# Patient Record
Sex: Female | Born: 1955 | Race: Black or African American | Hispanic: No | Marital: Single | Smoking: Never smoker
Health system: Southern US, Community
[De-identification: ages and names within clinical notes are randomized; demographics above are authoritative.]

## PROBLEM LIST (undated history)

## (undated) DIAGNOSIS — I639 Cerebral infarction, unspecified: Secondary | ICD-10-CM

## (undated) DIAGNOSIS — E119 Type 2 diabetes mellitus without complications: Secondary | ICD-10-CM

## (undated) DIAGNOSIS — I10 Essential (primary) hypertension: Secondary | ICD-10-CM

## (undated) DIAGNOSIS — I2089 Other forms of angina pectoris: Secondary | ICD-10-CM

## (undated) DIAGNOSIS — I208 Other forms of angina pectoris: Secondary | ICD-10-CM

## (undated) DIAGNOSIS — I509 Heart failure, unspecified: Secondary | ICD-10-CM

## (undated) HISTORY — PX: CHOLECYSTECTOMY: SHX55

## (undated) HISTORY — PX: ABDOMINAL HYSTERECTOMY: SHX81

---

## 2006-05-06 ENCOUNTER — Emergency Department (HOSPITAL_COMMUNITY): Admission: EM | Admit: 2006-05-06 | Discharge: 2006-05-06 | Payer: Self-pay | Admitting: Emergency Medicine

## 2011-03-09 ENCOUNTER — Emergency Department (HOSPITAL_BASED_OUTPATIENT_CLINIC_OR_DEPARTMENT_OTHER)
Admission: EM | Admit: 2011-03-09 | Discharge: 2011-03-09 | Disposition: A | Discharge status: Home / Self Care | Payer: Medicare Other | Attending: Emergency Medicine | Admitting: Emergency Medicine

## 2011-03-09 ENCOUNTER — Emergency Department (INDEPENDENT_AMBULATORY_CARE_PROVIDER_SITE_OTHER): Payer: Medicare Other

## 2011-03-09 DIAGNOSIS — R1110 Vomiting, unspecified: Secondary | ICD-10-CM

## 2011-03-09 DIAGNOSIS — R059 Cough, unspecified: Secondary | ICD-10-CM

## 2011-03-09 DIAGNOSIS — R109 Unspecified abdominal pain: Secondary | ICD-10-CM

## 2011-03-09 DIAGNOSIS — R05 Cough: Secondary | ICD-10-CM

## 2011-03-09 DIAGNOSIS — J329 Chronic sinusitis, unspecified: Secondary | ICD-10-CM | POA: Insufficient documentation

## 2011-03-09 DIAGNOSIS — Z79899 Other long term (current) drug therapy: Secondary | ICD-10-CM | POA: Insufficient documentation

## 2011-03-09 LAB — DIFFERENTIAL
Basophils Absolute: 0 10*3/uL (ref 0.0–0.1)
Eosinophils Absolute: 0.1 10*3/uL (ref 0.0–0.7)
Eosinophils Relative: 1 % (ref 0–5)
Lymphs Abs: 2.7 10*3/uL (ref 0.7–4.0)
Neutrophils Relative %: 65 % (ref 43–77)

## 2011-03-09 LAB — BASIC METABOLIC PANEL
CO2: 26 mEq/L (ref 19–32)
Chloride: 98 mEq/L (ref 96–112)
GFR calc Af Amer: >60 mL/min (ref >60)
Potassium: 3.9 mEq/L (ref 3.5–5.1)
Sodium: 137 mEq/L (ref 135–145)

## 2011-03-09 LAB — URINALYSIS, ROUTINE W REFLEX MICROSCOPIC
Glucose, UA: NEGATIVE mg/dL
Ketones, ur: NEGATIVE mg/dL
Leukocytes, UA: NEGATIVE
Nitrite: NEGATIVE
Protein, ur: NEGATIVE mg/dL
pH: 5.5 (ref 5.0–8.0)

## 2011-03-09 LAB — CBC
MCV: 78.7 fL (ref 78.0–100.0)
Platelets: 298 10*3/uL (ref 150–400)
RBC: 4.22 MIL/uL (ref 3.87–5.11)
RDW: 14.2 % (ref 11.5–15.5)
WBC: 10.1 10*3/uL (ref 4.0–10.5)

## 2011-03-12 ENCOUNTER — Emergency Department (HOSPITAL_BASED_OUTPATIENT_CLINIC_OR_DEPARTMENT_OTHER)
Admission: EM | Admit: 2011-03-12 | Discharge: 2011-03-12 | Disposition: A | Discharge status: Home / Self Care | Payer: Medicare Other | Attending: Emergency Medicine | Admitting: Emergency Medicine

## 2011-03-12 DIAGNOSIS — I10 Essential (primary) hypertension: Secondary | ICD-10-CM | POA: Insufficient documentation

## 2011-03-12 DIAGNOSIS — K219 Gastro-esophageal reflux disease without esophagitis: Secondary | ICD-10-CM | POA: Insufficient documentation

## 2011-03-12 DIAGNOSIS — E119 Type 2 diabetes mellitus without complications: Secondary | ICD-10-CM | POA: Insufficient documentation

## 2011-03-12 DIAGNOSIS — J329 Chronic sinusitis, unspecified: Secondary | ICD-10-CM | POA: Insufficient documentation

## 2011-03-12 DIAGNOSIS — J45909 Unspecified asthma, uncomplicated: Secondary | ICD-10-CM | POA: Insufficient documentation

## 2011-03-12 DIAGNOSIS — Z79899 Other long term (current) drug therapy: Secondary | ICD-10-CM | POA: Insufficient documentation

## 2011-03-12 DIAGNOSIS — I509 Heart failure, unspecified: Secondary | ICD-10-CM | POA: Insufficient documentation

## 2013-08-15 ENCOUNTER — Ambulatory Visit (INDEPENDENT_AMBULATORY_CARE_PROVIDER_SITE_OTHER): Payer: Medicare Other | Admitting: Podiatry

## 2013-08-15 ENCOUNTER — Encounter: Payer: Self-pay | Admitting: Podiatry

## 2013-08-15 VITALS — BP 172/79 | HR 86 | Ht 67.0 in | Wt 296.0 lb

## 2013-08-15 DIAGNOSIS — M216X9 Other acquired deformities of unspecified foot: Secondary | ICD-10-CM

## 2013-08-15 DIAGNOSIS — M722 Plantar fascial fibromatosis: Secondary | ICD-10-CM

## 2013-08-15 DIAGNOSIS — M21969 Unspecified acquired deformity of unspecified lower leg: Secondary | ICD-10-CM | POA: Insufficient documentation

## 2013-08-15 DIAGNOSIS — M216X1 Other acquired deformities of right foot: Secondary | ICD-10-CM

## 2013-08-15 MED ORDER — AMPICILLIN 500 MG PO CAPS: 500.0000 mg | ORAL_CAPSULE | Freq: Four times a day (QID) | ORAL | Status: DC

## 2013-08-15 NOTE — Progress Notes (Signed)
Right heel pain x for a month. Also gets ingrown nails on both big toes, and calluses under the big toes.  Blood sugar is under control. Usual readings are 117, 130, 140, 160.   Review of Systems - General ROS: positive for  - fever, night sweats and weight gain Ophthalmic ROS: Poor vision and difficulty reading. ENT ROS: Feel like sinus is infected. Allergy and Immunology ROS: negative Breast ROS: negative for breast lumps Respiratory ROS: no cough, shortness of breath, or wheezing Cardiovascular ROS: Has a mild congestive heart failure. Gastrointestinal ROS: problem with food digesting.  Genito-Urinary ROS: no dysuria, trouble voiding, or hematuria Musculoskeletal ROS: negative Dermatological ROS: negative . Objective: Dermatologic: Plantar callus under the left great toe. Both great toe nails are ingrown without infection. Vascular: Left dorsalis pedis artery is palpable. Right side is not palpable.  Orthopedic: Tight Achilles tendon on right. Unstable first ray bilateral.   Assessment: Ingrown nails both great toes. Right heel pain. Unstable first ray bilateral. Ankle equinus right.  Plan: Calluses debrided. Need stretch exercise for tight tendon. Need to wear well supported tennis shoes with custom orthotics. Diabetic shoes may benefit. Will do ingrown nails surgery on left side on her next visit.

## 2013-08-15 NOTE — Patient Instructions (Signed)
For right heel pain, need to wear well supported Tennis shoes and do Achilles tendon stretch exercise.  For ingrown toe nail, we will schedule for ingrown nails surgery on left. May take antibiotics for sinus infection.

## 2013-08-23 ENCOUNTER — Ambulatory Visit: Payer: Medicare Other | Admitting: Podiatry

## 2013-08-28 ENCOUNTER — Encounter (HOSPITAL_BASED_OUTPATIENT_CLINIC_OR_DEPARTMENT_OTHER): Payer: Self-pay | Admitting: Emergency Medicine

## 2013-08-28 ENCOUNTER — Emergency Department (HOSPITAL_BASED_OUTPATIENT_CLINIC_OR_DEPARTMENT_OTHER): Payer: Medicare Other

## 2013-08-28 ENCOUNTER — Emergency Department (HOSPITAL_BASED_OUTPATIENT_CLINIC_OR_DEPARTMENT_OTHER)
Admission: EM | Admit: 2013-08-28 | Discharge: 2013-08-28 | Disposition: A | Discharge status: Home / Self Care | Payer: Medicare Other | Attending: Emergency Medicine | Admitting: Emergency Medicine

## 2013-08-28 DIAGNOSIS — I10 Essential (primary) hypertension: Secondary | ICD-10-CM | POA: Insufficient documentation

## 2013-08-28 DIAGNOSIS — R519 Headache, unspecified: Secondary | ICD-10-CM

## 2013-08-28 DIAGNOSIS — Z79899 Other long term (current) drug therapy: Secondary | ICD-10-CM | POA: Insufficient documentation

## 2013-08-28 DIAGNOSIS — E119 Type 2 diabetes mellitus without complications: Secondary | ICD-10-CM | POA: Insufficient documentation

## 2013-08-28 DIAGNOSIS — Z8673 Personal history of transient ischemic attack (TIA), and cerebral infarction without residual deficits: Secondary | ICD-10-CM | POA: Insufficient documentation

## 2013-08-28 DIAGNOSIS — R51 Headache: Secondary | ICD-10-CM | POA: Insufficient documentation

## 2013-08-28 HISTORY — DX: Type 2 diabetes mellitus without complications: E11.9

## 2013-08-28 HISTORY — DX: Cerebral infarction, unspecified: I63.9

## 2013-08-28 HISTORY — DX: Essential (primary) hypertension: I10

## 2013-08-28 MED ORDER — SODIUM CHLORIDE 0.9 % IV BOLUS (SEPSIS)
1000.0000 mL | Freq: Once | INTRAVENOUS | Status: AC
  Administered 2013-08-28: 1000 mL via INTRAVENOUS

## 2013-08-28 MED ORDER — DEXAMETHASONE SODIUM PHOSPHATE 10 MG/ML IJ SOLN
10.0000 mg | Freq: Once | INTRAMUSCULAR | Status: AC
  Administered 2013-08-28: 10 mg via INTRAVENOUS
  Filled 2013-08-28: qty 1

## 2013-08-28 MED ORDER — DIPHENHYDRAMINE HCL 50 MG/ML IJ SOLN
25.0000 mg | Freq: Once | INTRAMUSCULAR | Status: AC
  Administered 2013-08-28: 25 mg via INTRAVENOUS
  Filled 2013-08-28: qty 1

## 2013-08-28 MED ORDER — METOCLOPRAMIDE HCL 5 MG/ML IJ SOLN
10.0000 mg | Freq: Once | INTRAMUSCULAR | Status: AC
  Administered 2013-08-28: 10 mg via INTRAVENOUS
  Filled 2013-08-28: qty 2

## 2013-08-28 NOTE — ED Provider Notes (Signed)
CSN: [id number]     Arrival date & time 08/28/13  1255 History   First MD Initiated Contact with Patient 08/28/13 1359     Chief Complaint  Patient presents with  . Headache   (Consider location/radiation/quality/duration/timing/severity/associated sxs/prior Treatment) HPI Comments: Patient is a 57 year old female with history of diabetes and prior stroke. She presents today with complaints of frontal headache that she has been experiencing for the past week. It has been worse over the past 2 days. She denies any injury or trauma. She denies any numbness or tingling. She denies any visual disturbances. She is no history of migraine headaches states that this is very unusual for her.  Patient is a 57 y.o. female presenting with headaches. The history is provided by the patient.  Headache Pain location:  Frontal Quality:  Stabbing Radiates to:  Does not radiate Onset quality:  Gradual Duration:  7 days Timing:  Constant Progression:  Worsening Chronicity:  New Similar to prior headaches: no   Context: activity and bright light   Context: not caffeine   Relieved by:  Nothing Worsened by:  Nothing tried Ineffective treatments:  None tried   Past Medical History  Diagnosis Date  . Stroke   . Hypertension   . Diabetes mellitus without complication    History reviewed. No pertinent past surgical history. No family history on file. History  Substance Use Topics  . Smoking status: Never Smoker   . Smokeless tobacco: Never Used  . Alcohol Use: Not on file   OB History   Grav Para Term Preterm Abortions TAB SAB Ect Mult Living                 Review of Systems  Neurological: Positive for headaches.  All other systems reviewed and are negative.    Allergies  Review of patient's allergies indicates no known allergies.  Home Medications   Current Outpatient Rx  Name  Route  Sig  Dispense  Refill  . carbamazepine (TEGRETOL) 100 MG chewable tablet   Oral   Chew 100 mg by  mouth daily.          Marland Kitchen DEXILANT 60 MG capsule   Oral   Take 60 mg by mouth as needed.          . hydrochlorothiazide (HYDRODIURIL) 25 MG tablet   Oral   Take 25 mg by mouth daily.          Marland Kitchen lisinopril (PRINIVIL,ZESTRIL) 40 MG tablet   Oral   Take 40 mg by mouth daily.          . metFORMIN (GLUCOPHAGE) 1000 MG tablet   Oral   Take 1,000 mg by mouth 2 (two) times daily with a meal.          . metoprolol (LOPRESSOR) 50 MG tablet   Oral   Take 50 mg by mouth 2 (two) times daily.          Marland Kitchen NITROSTAT 0.4 MG SL tablet   Sublingual   Place 0.4 mg under the tongue every 5 (five) minutes as needed.          . sucralfate (CARAFATE) 1 G tablet   Oral   Take 1 g by mouth 2 (two) times daily.           BP 141/99  Pulse 64  Temp(Src) 98.8 F (37.1 C) (Oral)  Resp 18  SpO2 99% Physical Exam  Nursing note and vitals reviewed. Constitutional: She is oriented to person,  place, and time. She appears well-developed and well-nourished. No distress.  HENT:  Head: Normocephalic and atraumatic.  Eyes: EOM are normal. Pupils are equal, round, and reactive to light.  There is no papilledema on funduscopic examination  Neck: Normal range of motion. Neck supple.  Cardiovascular: Normal rate and regular rhythm.  Exam reveals no gallop and no friction rub.   No murmur heard. Pulmonary/Chest: Effort normal and breath sounds normal. No respiratory distress. She has no wheezes.  Abdominal: Soft. Bowel sounds are normal. She exhibits no distension. There is no tenderness.  Musculoskeletal: Normal range of motion.  Neurological: She is alert and oriented to person, place, and time. No cranial nerve deficit. She exhibits normal muscle tone. Coordination normal.  Skin: Skin is warm and dry. She is not diaphoretic.    ED Course  Procedures (including critical care time) Labs Review Labs Reviewed - No data to display Imaging Review No results found.    MDM  No diagnosis  found. The patient presents with headache for the past several days. Neurologic exam is nonfocal and CAT scan is unremarkable. There is no evidence for intracranial pathology and no evidence for sinus disease. She is feeling better with medications given and will be discharged to home with when necessary followup.    Geoffery Lyons, MD 08/28/13 1538

## 2013-08-28 NOTE — ED Notes (Signed)
Patient here with frontal headache x 2 days, taking otc meds without relief. Nausea with same, denies injury

## 2013-09-05 ENCOUNTER — Ambulatory Visit (INDEPENDENT_AMBULATORY_CARE_PROVIDER_SITE_OTHER): Payer: Medicare Other | Admitting: Podiatry

## 2013-09-05 ENCOUNTER — Encounter: Payer: Self-pay | Admitting: Podiatry

## 2013-09-05 VITALS — BP 142/74 | HR 81 | Ht 67.0 in | Wt 296.0 lb

## 2013-09-05 DIAGNOSIS — E1149 Type 2 diabetes mellitus with other diabetic neurological complication: Secondary | ICD-10-CM

## 2013-09-05 DIAGNOSIS — M21969 Unspecified acquired deformity of unspecified lower leg: Secondary | ICD-10-CM

## 2013-09-05 DIAGNOSIS — M722 Plantar fascial fibromatosis: Secondary | ICD-10-CM

## 2013-09-05 DIAGNOSIS — M216X1 Other acquired deformities of right foot: Secondary | ICD-10-CM

## 2013-09-05 DIAGNOSIS — M216X9 Other acquired deformities of unspecified foot: Secondary | ICD-10-CM

## 2013-09-05 NOTE — Progress Notes (Signed)
Subjective: Right foot pain on heel. Patient took care of her ingrown nail problem.  Stated that her right foot pulse was detectable at her primary care physician's office.   Findings:  Elevated first ray bilateral. Ankle equinus bilateral. STJ hyperpronation bilateral. Plantar fasciitis right.  Plan: Reviewed available options, Orthotics, diabetics shoes. Today both feet were measured for diabetic shoes.

## 2013-09-05 NOTE — Patient Instructions (Signed)
Both feet were measured for diabetic shoes. Will contact patient when they are ready.

## 2013-09-20 ENCOUNTER — Ambulatory Visit: Payer: Medicare Other | Admitting: Podiatry

## 2013-09-26 ENCOUNTER — Ambulatory Visit: Payer: Medicare Other | Admitting: Podiatry

## 2014-01-05 ENCOUNTER — Encounter (HOSPITAL_BASED_OUTPATIENT_CLINIC_OR_DEPARTMENT_OTHER): Payer: Self-pay | Admitting: Emergency Medicine

## 2014-01-05 ENCOUNTER — Other Ambulatory Visit: Payer: Self-pay

## 2014-01-05 ENCOUNTER — Observation Stay (HOSPITAL_COMMUNITY): Payer: Medicare Other

## 2014-01-05 ENCOUNTER — Observation Stay (HOSPITAL_BASED_OUTPATIENT_CLINIC_OR_DEPARTMENT_OTHER)
Admission: EM | Admit: 2014-01-05 | Discharge: 2014-01-06 | Disposition: A | Discharge status: Home / Self Care | Payer: Medicare Other | Attending: Internal Medicine | Admitting: Internal Medicine

## 2014-01-05 DIAGNOSIS — I10 Essential (primary) hypertension: Secondary | ICD-10-CM | POA: Diagnosis present

## 2014-01-05 DIAGNOSIS — R079 Chest pain, unspecified: Principal | ICD-10-CM | POA: Diagnosis present

## 2014-01-05 DIAGNOSIS — D72829 Elevated white blood cell count, unspecified: Secondary | ICD-10-CM | POA: Insufficient documentation

## 2014-01-05 DIAGNOSIS — Z7982 Long term (current) use of aspirin: Secondary | ICD-10-CM | POA: Diagnosis not present

## 2014-01-05 DIAGNOSIS — R0602 Shortness of breath: Secondary | ICD-10-CM | POA: Insufficient documentation

## 2014-01-05 DIAGNOSIS — D649 Anemia, unspecified: Secondary | ICD-10-CM | POA: Insufficient documentation

## 2014-01-05 DIAGNOSIS — M79609 Pain in unspecified limb: Secondary | ICD-10-CM | POA: Insufficient documentation

## 2014-01-05 DIAGNOSIS — E119 Type 2 diabetes mellitus without complications: Secondary | ICD-10-CM | POA: Diagnosis not present

## 2014-01-05 DIAGNOSIS — I509 Heart failure, unspecified: Secondary | ICD-10-CM | POA: Insufficient documentation

## 2014-01-05 DIAGNOSIS — Z8673 Personal history of transient ischemic attack (TIA), and cerebral infarction without residual deficits: Secondary | ICD-10-CM | POA: Diagnosis not present

## 2014-01-05 DIAGNOSIS — Z794 Long term (current) use of insulin: Secondary | ICD-10-CM | POA: Diagnosis not present

## 2014-01-05 DIAGNOSIS — R269 Unspecified abnormalities of gait and mobility: Secondary | ICD-10-CM | POA: Insufficient documentation

## 2014-01-05 DIAGNOSIS — R569 Unspecified convulsions: Secondary | ICD-10-CM | POA: Diagnosis not present

## 2014-01-05 HISTORY — DX: Other forms of angina pectoris: I20.89

## 2014-01-05 HISTORY — DX: Heart failure, unspecified: I50.9

## 2014-01-05 HISTORY — DX: Other forms of angina pectoris: I20.8

## 2014-01-05 LAB — CBC WITH DIFFERENTIAL/PLATELET
BASOS PCT: 0 % (ref 0–1)
Basophils Absolute: 0 10*3/uL (ref 0.0–0.1)
Basophils Absolute: 0 10*3/uL (ref 0.0–0.1)
Basophils Relative: 0 % (ref 0–1)
EOS ABS: 0.1 10*3/uL (ref 0.0–0.7)
EOS ABS: 0.2 10*3/uL (ref 0.0–0.7)
EOS PCT: 1 % (ref 0–5)
Eosinophils Relative: 1 % (ref 0–5)
HCT: 33.6 % — ABNORMAL LOW (ref 36.0–46.0)
HCT: 33.6 % — ABNORMAL LOW (ref 36.0–46.0)
HEMOGLOBIN: 11 g/dL — AB (ref 12.0–15.0)
Hemoglobin: 11.3 g/dL — ABNORMAL LOW (ref 12.0–15.0)
LYMPHS ABS: 5 10*3/uL — AB (ref 0.7–4.0)
LYMPHS PCT: 45 % (ref 12–46)
Lymphocytes Relative: 41 % (ref 12–46)
Lymphs Abs: 4.5 10*3/uL — ABNORMAL HIGH (ref 0.7–4.0)
MCH: 27.7 pg (ref 26.0–34.0)
MCH: 26.6 pg (ref 26.0–34.0)
MCHC: 33.6 g/dL (ref 30.0–36.0)
MCHC: 32.7 g/dL (ref 30.0–36.0)
MCV: 82.4 fL (ref 78.0–100.0)
MCV: 81.4 fL (ref 78.0–100.0)
MONOS PCT: 8 % (ref 3–12)
MONOS PCT: 7 % (ref 3–12)
Monocytes Absolute: 0.8 10*3/uL (ref 0.1–1.0)
Monocytes Absolute: 0.8 10*3/uL (ref 0.1–1.0)
NEUTROS PCT: 49 % (ref 43–77)
Neutro Abs: 5.4 10*3/uL (ref 1.7–7.7)
Neutro Abs: 5.1 10*3/uL (ref 1.7–7.7)
Neutrophils Relative %: 47 % (ref 43–77)
PLATELETS: 350 10*3/uL (ref 150–400)
PLATELETS: 335 10*3/uL (ref 150–400)
RBC: 4.08 MIL/uL (ref 3.87–5.11)
RBC: 4.13 MIL/uL (ref 3.87–5.11)
RDW: 13.5 % (ref 11.5–15.5)
RDW: 13.7 % (ref 11.5–15.5)
WBC: 10.8 10*3/uL — ABNORMAL HIGH (ref 4.0–10.5)
WBC: 11.1 10*3/uL — AB (ref 4.0–10.5)

## 2014-01-05 LAB — COMPREHENSIVE METABOLIC PANEL
ALBUMIN: 4 g/dL (ref 3.5–5.2)
ALT: 34 U/L (ref 0–35)
AST: 28 U/L (ref 0–37)
Alkaline Phosphatase: 63 U/L (ref 39–117)
BUN: 24 mg/dL — AB (ref 6–23)
CALCIUM: 9.8 mg/dL (ref 8.4–10.5)
CHLORIDE: 97 meq/L (ref 96–112)
CO2: 20 mEq/L (ref 19–32)
CREATININE: 0.86 mg/dL (ref 0.50–1.10)
GFR calc Af Amer: 84 mL/min — ABNORMAL LOW (ref >90)
GFR calc non Af Amer: 73 mL/min — ABNORMAL LOW (ref >90)
Glucose, Bld: 136 mg/dL — ABNORMAL HIGH (ref 70–99)
Potassium: 4 mEq/L (ref 3.7–5.3)
Sodium: 137 mEq/L (ref 137–147)
Total Protein: 7 g/dL (ref 6.0–8.3)

## 2014-01-05 LAB — TROPONIN I
Troponin I: <0.3 ng/mL (ref <0.30)
Troponin I: <0.3 ng/mL (ref <0.30)
Troponin I: <0.3 ng/mL (ref <0.30)

## 2014-01-05 LAB — BASIC METABOLIC PANEL
BUN: 27 mg/dL — AB (ref 6–23)
CALCIUM: 10 mg/dL (ref 8.4–10.5)
CO2: 24 mEq/L (ref 19–32)
Chloride: 97 mEq/L (ref 96–112)
Creatinine, Ser: 1 mg/dL (ref 0.50–1.10)
GFR, EST AFRICAN AMERICAN: 70 mL/min — AB (ref >90)
GFR, EST NON AFRICAN AMERICAN: 60 mL/min — AB (ref >90)
Glucose, Bld: 151 mg/dL — ABNORMAL HIGH (ref 70–99)
POTASSIUM: 4.8 meq/L (ref 3.7–5.3)
SODIUM: 136 meq/L — AB (ref 137–147)

## 2014-01-05 LAB — GLUCOSE, CAPILLARY
GLUCOSE-CAPILLARY: 170 mg/dL — AB (ref 70–99)
Glucose-Capillary: 133 mg/dL — ABNORMAL HIGH (ref 70–99)
Glucose-Capillary: 157 mg/dL — ABNORMAL HIGH (ref 70–99)
Glucose-Capillary: 147 mg/dL — ABNORMAL HIGH (ref 70–99)

## 2014-01-05 LAB — URINALYSIS, ROUTINE W REFLEX MICROSCOPIC
Bilirubin Urine: NEGATIVE
Glucose, UA: NEGATIVE mg/dL
Hgb urine dipstick: NEGATIVE
KETONES UR: NEGATIVE mg/dL
LEUKOCYTES UA: NEGATIVE
NITRITE: NEGATIVE
PROTEIN: NEGATIVE mg/dL
Specific Gravity, Urine: 1.02 (ref 1.005–1.030)
Urobilinogen, UA: 0.2 mg/dL (ref 0.0–1.0)
pH: 5.5 (ref 5.0–8.0)

## 2014-01-05 LAB — RAPID URINE DRUG SCREEN, HOSP PERFORMED
AMPHETAMINES: NOT DETECTED
Barbiturates: NOT DETECTED
Benzodiazepines: NOT DETECTED
Cocaine: NOT DETECTED
Opiates: NOT DETECTED
TETRAHYDROCANNABINOL: NOT DETECTED

## 2014-01-05 MED ORDER — ASPIRIN EC 325 MG PO TBEC
325.0000 mg | DELAYED_RELEASE_TABLET | Freq: Every day | ORAL | Status: DC
  Administered 2014-01-05 – 2014-01-06 (×2): 325 mg via ORAL
  Filled 2014-01-05 (×2): qty 1

## 2014-01-05 MED ORDER — METOPROLOL TARTRATE 50 MG PO TABS
50.0000 mg | ORAL_TABLET | Freq: Two times a day (BID) | ORAL | Status: DC
  Administered 2014-01-05 – 2014-01-06 (×3): 50 mg via ORAL
  Filled 2014-01-05 (×4): qty 1

## 2014-01-05 MED ORDER — REGADENOSON 0.4 MG/5ML IV SOLN
INTRAVENOUS | Status: AC
  Administered 2014-01-05: 0.4 mg via INTRAVENOUS
  Filled 2014-01-05: qty 5

## 2014-01-05 MED ORDER — SUCRALFATE 1 G PO TABS
1.0000 g | ORAL_TABLET | Freq: Two times a day (BID) | ORAL | Status: DC
  Administered 2014-01-05 – 2014-01-06 (×3): 1 g via ORAL
  Filled 2014-01-05 (×4): qty 1

## 2014-01-05 MED ORDER — REGADENOSON 0.4 MG/5ML IV SOLN
0.4000 mg | Freq: Once | INTRAVENOUS | Status: AC
Start: 2014-01-05 — End: 2014-01-05
  Administered 2014-01-05: 0.4 mg via INTRAVENOUS
  Filled 2014-01-05: qty 5

## 2014-01-05 MED ORDER — HYDROCHLOROTHIAZIDE 25 MG PO TABS
25.0000 mg | ORAL_TABLET | Freq: Every day | ORAL | Status: DC
  Administered 2014-01-05 – 2014-01-06 (×2): 25 mg via ORAL
  Filled 2014-01-05 (×2): qty 1

## 2014-01-05 MED ORDER — HYDROCODONE-ACETAMINOPHEN 5-325 MG PO TABS
ORAL_TABLET | ORAL | Status: AC
Start: 2014-01-05 — End: 2014-01-05
  Administered 2014-01-05: 1 via ORAL
  Filled 2014-01-05: qty 1

## 2014-01-05 MED ORDER — NITROGLYCERIN 0.4 MG SL SUBL: 0.4000 mg | SUBLINGUAL_TABLET | SUBLINGUAL | Status: DC | PRN

## 2014-01-05 MED ORDER — ENOXAPARIN SODIUM 30 MG/0.3ML ~~LOC~~ SOLN
30.0000 mg | SUBCUTANEOUS | Status: DC
  Administered 2014-01-05: 30 mg via SUBCUTANEOUS
  Filled 2014-01-05 (×2): qty 0.3

## 2014-01-05 MED ORDER — HYDROCODONE-ACETAMINOPHEN 5-325 MG PO TABS
2.0000 | ORAL_TABLET | Freq: Once | ORAL | Status: AC
  Administered 2014-01-05: 1 via ORAL

## 2014-01-05 MED ORDER — CARBAMAZEPINE 100 MG PO CHEW
100.0000 mg | CHEWABLE_TABLET | Freq: Every day | ORAL | Status: DC
  Administered 2014-01-05 – 2014-01-06 (×2): 100 mg via ORAL
  Filled 2014-01-05 (×2): qty 1

## 2014-01-05 MED ORDER — INSULIN GLARGINE 100 UNIT/ML ~~LOC~~ SOLN
16.0000 [IU] | Freq: Two times a day (BID) | SUBCUTANEOUS | Status: DC
  Administered 2014-01-05 – 2014-01-06 (×3): 16 [IU] via SUBCUTANEOUS
  Filled 2014-01-05 (×4): qty 0.16

## 2014-01-05 MED ORDER — LISINOPRIL 40 MG PO TABS
40.0000 mg | ORAL_TABLET | Freq: Every day | ORAL | Status: DC
  Administered 2014-01-05 – 2014-01-06 (×2): 40 mg via ORAL
  Filled 2014-01-05 (×2): qty 1

## 2014-01-05 MED ORDER — PANTOPRAZOLE SODIUM 40 MG PO TBEC
40.0000 mg | DELAYED_RELEASE_TABLET | Freq: Every day | ORAL | Status: DC
  Administered 2014-01-05 – 2014-01-06 (×2): 40 mg via ORAL
  Filled 2014-01-05 (×2): qty 1

## 2014-01-05 MED ORDER — NITROGLYCERIN 0.4 MG SL SUBL
0.4000 mg | SUBLINGUAL_TABLET | SUBLINGUAL | Status: DC | PRN
  Administered 2014-01-05 (×2): 0.4 mg via SUBLINGUAL
  Filled 2014-01-05: qty 1
  Filled 2014-01-05: qty 25
  Filled 2014-01-05: qty 1

## 2014-01-05 MED ORDER — INSULIN ASPART 100 UNIT/ML ~~LOC~~ SOLN
0.0000 [IU] | Freq: Three times a day (TID) | SUBCUTANEOUS | Status: DC
  Administered 2014-01-05 (×2): 2 [IU] via SUBCUTANEOUS

## 2014-01-05 NOTE — ED Notes (Signed)
C/o rt chest pain stabbing, clammy and sob,  No distress at present   Denies cp or sob at present

## 2014-01-05 NOTE — Progress Notes (Signed)
UR completed 

## 2014-01-05 NOTE — Progress Notes (Signed)
[id number] RN called to room for pt c/o of pain midsternal 5/10 b/p 111/64, 4L o2 ,EKG, nitro given with relief to 0/10

## 2014-01-05 NOTE — Care Management Note (Unsigned)
    Page 1 of 1   01/05/2014     12:13:04 PM   CARE MANAGEMENT NOTE 01/05/2014  Patient:  Sarah Ewing,Sarah Ewing   Account Number:  [id number]  Date Initiated:  01/05/2014  Documentation initiated by:  GRAVES-BIGELOW,Tasheem Elms  Subjective/Objective Assessment:   Pt admitted for chest pain. Plan for two day stress test.     Action/Plan:   CM did speak to pt in reference to disposition needs. Pt is on disability and is requesting DME RW and Paediatric nursehower Chair. CM wil place orders. Did contact MD for PT consult as well. CM will monitor.   Anticipated DC Date:  01/06/2014   Anticipated DC Plan:        DC Planning Services  CM consult      PAC Choice  DURABLE MEDICAL EQUIPMENT   Choice offered to / List presented to:  C-1 Patient   DME arranged  [facility]  [facility]      DME agency  [facility]        Status of service:  In process, will continue to follow Medicare Important Message given?   (If response is "NO", the following Medicare IM given date fields will be blank) Date Medicare IM given:   Date Additional Medicare IM given:    Discharge Disposition:    Per UR Regulation:  Reviewed for med. necessity/level of care/duration of stay  If discussed at Long Length of Stay Meetings, dates discussed:    Comments:

## 2014-01-05 NOTE — Progress Notes (Signed)
  Echocardiogram 2D Echocardiogram has been performed.  Hermine MessickLauren A Williams 01/05/2014, 11:25 AM

## 2014-01-05 NOTE — Progress Notes (Signed)
Lexiscan CL performed. 2-day study, [facility] to read 04/17.

## 2014-01-05 NOTE — ED Provider Notes (Signed)
CSN: [id number]     Arrival date & time 01/05/14  0103 History   None    Chief Complaint  Patient presents with  . Chest Pain     (Consider location/radiation/quality/duration/timing/severity/associated sxs/prior Treatment) HPI This is a 58 year old female with a history of angina. She is new to the area and does not have a local cardiologist. She is here with a severe pain to her right chest that began just prior to arrival. The pain occurred at rest. She describes it as sharp and feeling like previous angina but worse. She took sublingual nitroglycerin with partial relief. EMS gave her additional nitroglycerin and aspirin and she is pain-free now. The chest pain was associated with diaphoresis and shortness of breath but no nausea.  Past Medical History  Diagnosis Date  . Stroke   . Hypertension   . Diabetes mellitus without complication   . CHF (congestive heart failure)   . Angina at rest    History reviewed. No pertinent past surgical history. History reviewed. No pertinent family history. History  Substance Use Topics  . Smoking status: Never Smoker   . Smokeless tobacco: Never Used  . Alcohol Use: No   OB History   Grav Para Term Preterm Abortions TAB SAB Ect Mult Living                 Review of Systems  All other systems reviewed and are negative.  Allergies  Review of patient's allergies indicates no known allergies.  Home Medications   Prior to Admission medications   Medication Sig Start Date End Date Taking? Authorizing Provider  carbamazepine (TEGRETOL) 100 MG chewable tablet Chew 100 mg by mouth daily.  07/12/13   Historical Provider, MD  DEXILANT 60 MG capsule Take 60 mg by mouth as needed.  07/12/13   Historical Provider, MD  hydrochlorothiazide (HYDRODIURIL) 25 MG tablet Take 25 mg by mouth daily.  08/11/13   Historical Provider, MD  insulin glargine (LANTUS) 100 UNIT/ML injection Inject 16 Units into the skin 2 (two) times daily.    Historical  Provider, MD  lisinopril (PRINIVIL,ZESTRIL) 40 MG tablet Take 40 mg by mouth daily.  08/08/13   Historical Provider, MD  metFORMIN (GLUCOPHAGE) 1000 MG tablet Take 1,000 mg by mouth 2 (two) times daily with a meal.  08/08/13   Historical Provider, MD  metoprolol (LOPRESSOR) 50 MG tablet Take 50 mg by mouth 2 (two) times daily.  08/11/13   Historical Provider, MD  NITROSTAT 0.4 MG SL tablet Place 0.4 mg under the tongue every 5 (five) minutes as needed.  07/12/13   Historical Provider, MD  sucralfate (CARAFATE) 1 G tablet Take 1 g by mouth 2 (two) times daily.  07/12/13   Historical Provider, MD   BP 127/63  Pulse 73  Temp(Src) 98.1 F (36.7 C) (Oral)  Resp 18  Ht 5\' 7"  (1.702 m)  Wt 297 lb (134.718 kg)  BMI 46.51 kg/m2  SpO2 99%  Physical Exam General: Well-developed, well-nourished female in no acute distress; appearance consistent with age of record HENT: normocephalic; atraumatic Eyes: pupils equal, round and reactive to light; extraocular muscles intact Neck: supple Heart: regular rate and rhythm; no murmurs, rubs or gallops Lungs: clear to auscultation bilaterally Abdomen: soft; nondistended; nontender; bowel sounds present Extremities: No deformity; full range of motion; pulses normal; no edema Neurologic: Awake, alert and oriented; motor function intact in all extremities and symmetric; no facial droop Skin: Warm and dry Psychiatric: Flat affect    ED  Course  Procedures (including critical care time)   MDM   Nursing notes and vitals signs, including pulse oximetry, reviewed.  Summary of this visit's results, reviewed by myself:  Labs:  Results for orders placed during the hospital encounter of 01/05/14 (from the past 24 hour(s))  CBC WITH DIFFERENTIAL     Status: Abnormal   Collection Time    01/05/14  2:00 AM      Result Value Ref Range   WBC 10.8 (*) 4.0 - 10.5 K/uL   RBC 4.08  3.87 - 5.11 MIL/uL   Hemoglobin 11.3 (*) 12.0 - 15.0 g/dL   HCT 14.733.6 (*) 82.936.0 -  46.0 %   MCV 82.4  78.0 - 100.0 fL   MCH 27.7  26.0 - 34.0 pg   MCHC 33.6  30.0 - 36.0 g/dL   RDW 56.213.5  13.011.5 - 86.515.5 %   Platelets 350  150 - 400 K/uL   Neutrophils Relative % 49  43 - 77 %   Neutro Abs 5.4  1.7 - 7.7 K/uL   Lymphocytes Relative 41  12 - 46 %   Lymphs Abs 4.5 (*) 0.7 - 4.0 K/uL   Monocytes Relative 8  3 - 12 %   Monocytes Absolute 0.8  0.1 - 1.0 K/uL   Eosinophils Relative 1  0 - 5 %   Eosinophils Absolute 0.1  0.0 - 0.7 K/uL   Basophils Relative 0  0 - 1 %   Basophils Absolute 0.0  0.0 - 0.1 K/uL  BASIC METABOLIC PANEL     Status: Abnormal   Collection Time    01/05/14  2:00 AM      Result Value Ref Range   Sodium 136 (*) 137 - 147 mEq/L   Potassium 4.8  3.7 - 5.3 mEq/L   Chloride 97  96 - 112 mEq/L   CO2 24  19 - 32 mEq/L   Glucose, Bld 151 (*) 70 - 99 mg/dL   BUN 27 (*) 6 - 23 mg/dL   Creatinine, Ser 7.841.00  0.50 - 1.10 mg/dL   Calcium 69.610.0  8.4 - 29.510.5 mg/dL   GFR calc non Af Amer 60 (*) >90 mL/min   GFR calc Af Amer 70 (*) >90 mL/min  TROPONIN I     Status: None   Collection Time    01/05/14  2:00 AM      Result Value Ref Range   Troponin I <0.30  <0.30 ng/mL   EKG Interpretation:  Date & Time: 01/05/2014 1:02AM  Rate: 71  Rhythm: normal sinus rhythm  QRS Axis: normal  Intervals: normal  ST/T Wave abnormalities: normal  Conduction Disutrbances:none  Narrative Interpretation:   Old EKG Reviewed: none available  3:19 AM The patient continues to be pain-free. [facility]   Hanley SeamenJohn L Andreal Vultaggio, MD 01/05/14 [phone]

## 2014-01-05 NOTE — Progress Notes (Signed)
Patient seen and examined. Admission HPI reviewed. Patient chest pain free. Plan to do Lexyscan. Depending on results patient might be discharge home today. Check UA and UDS. Mildly increase WBC.   Sarah Casco, Md.

## 2014-01-05 NOTE — ED Notes (Signed)
Prior to transport pt states has 2/10 rt side cp  Given 1 ntg sl

## 2014-01-05 NOTE — Evaluation (Signed)
Physical Therapy Evaluation Patient Details Name: Sarah Ewing MRN: [id number] DOB: 11/17/1955 Today's Date: 01/05/2014   History of Present Illness  58 y.o. female [facility] with sudden onset sharp chest pain.  Cardiac markers and EKG negative for an acute event.  Further cardiac workup pending.  Pt with significant PMHx of CVA with resudual left leg weakness, chronic back pain (per pt prevents her from stooping forward to clean her tub, mop or vacuum), DM, HTN, morbid obesity, CHF, and HTN.    Clinical Impression  Pt with abnormal gait pattern.  She would be safer and at less risk of falls if she had a RW for home use.  Due to chronic, debilitating low back issues she cannot do some of her household chores that are needed to stay clean.  She would also [facility].   PT to follow acutely for deficits listed below.      Follow Up Recommendations No PT follow up    Equipment Recommendations  Rolling walker with 5" wheels;Other (comment) (HH aide to help with cleaning)    Recommendations for Other Services   None    Precautions / Restrictions Precautions Precautions: Fall Precaution Comments: midly unsteady on her feet with h/o falls      Mobility  Bed Mobility Overal bed mobility: Modified Independent                Transfers Overall transfer level: Needs assistance Equipment used: None Transfers: Sit to/from Stand Sit to Stand: Supervision         General transfer comment: supervision for safety as pt is reliant on upper extremity support for transitions.   Ambulation/Gait Ambulation/Gait assistance: Supervision Ambulation Distance (Feet): 120 Feet Assistive device: None Gait Pattern/deviations: Step-through pattern;Decreased stance time - left;Antalgic;Wide base of support Gait velocity: decreased Gait velocity interpretation: Below normal speed for age/gender General Gait Details: pt with functional weakness in left leg and then by  the end of gait right heel was hurting and increased antalgic gait pattern.  Pt tried to rush to get off of her feet and with the history and abnormal gait, is at risk for falling.  She woudl benefit from RW use for home and when she does have a back flare up         Balance Overall balance assessment: Needs assistance Sitting-balance support: No upper extremity supported;Feet supported Sitting balance-Leahy Scale: Good     Standing balance support: No upper extremity supported Standing balance-Leahy Scale: Fair                               Pertinent Vitals/Pain See vitals flow sheet.     Home Living Family/patient expects to be discharged to:: Private residence Living Arrangements: Alone   Type of Home: Apartment Home Access: Elevator     Home Layout: One level Home Equipment: None      Prior Function Level of Independence: Needs assistance   Gait / Transfers Assistance Needed: when her back "goes out" she has been hospitialized in the past and unable to walk.   ADL's / Homemaking Assistance Needed: needs help with cleaning the tub and floors,           Extremity/Trunk Assessment   Upper Extremity Assessment: Overall WFL for tasks assessed           Lower Extremity Assessment: LLE deficits/detail;RLE deficits/detail RLE Deficits / Details: per pt report some  right heel pain with gait wihtout her diabetic shoes donned.   LLE Deficits / Details: left leg with residual weakness from PMHx of stroke.  Functionally weak during gait, but did not buckle today.   Cervical / Trunk Assessment: Other exceptions  Communication   Communication: No difficulties  Cognition Arousal/Alertness: Awake/alert Behavior During Therapy: WFL for tasks assessed/performed Overall Cognitive Status: Within Functional Limits for tasks assessed                               Assessment/Plan    PT Assessment Patient needs continued PT services  PT  Diagnosis Difficulty walking;Abnormality of gait;Generalized weakness   PT Problem List Decreased strength;Decreased activity tolerance;Decreased balance;Decreased mobility;Decreased knowledge of use of DME;Obesity;Pain  PT Treatment Interventions DME instruction;Gait training;Functional mobility training;Therapeutic activities;Therapeutic exercise;Balance training;Neuromuscular re-education;Cognitive remediation;Patient/family education;Modalities   PT Goals (Current goals can be found in the Care Plan section) Acute Rehab PT Goals Patient Stated Goal: to get the help she needs PT Goal Formulation: With patient Time For Goal Achievement: 01/19/14 Potential to Achieve Goals: Good    Frequency Min 3X/week    End of Session   Activity Tolerance: Patient limited by pain Patient left: in bed;with call bell/phone within reach           Time: [id number] PT Time Calculation (min): 15 min   Charges:   PT Evaluation $Initial PT Evaluation Tier I: 1 Procedure PT Treatments $Gait Training: 8-22 mins        Sarah Ewing B. Jacon Whetzel, PT, DPT [phone]   01/05/2014, 4:14 PM

## 2014-01-05 NOTE — H&P (Addendum)
[facility] History and Physical  Sarah Ewing HQI:[id number] DOB: [birth year] DOA: 01/05/2014  Referring physician: ER physician. Patient was transferred from [facility]. PCP: No PCP Per Patient   Chief Complaint: Chest pain.  HPI: Sarah Ewing is a 58 y.o. female with history of diabetes mellitus, hypertension, stroke, seizures presented to the ER because of chest pain. Patient has just recently moved to [place]. Patient was talking on the phone when patient suddenly experienced a sharp pain on the right side of the chest. Patient took some sublingual nitroglycerin which helped the pain but had to take one more. Patient had mild shortness of breath. Patient called EMS and was brought to the ER. Cardiac markers EKG were negative. Since patient has cardiac risk factors and has had improvement with nitroglycerin patient has been admitted for further management. Patient denies any nausea vomiting abdominal pain. Has had cardiac catheter previously.   Review of Systems: As presented in the history of presenting illness, rest negative.  Past Medical History  Diagnosis Date  . Stroke   . Hypertension   . Diabetes mellitus without complication   . CHF (congestive heart failure)   . Angina at rest    History reviewed. No pertinent past surgical history. Social History:  reports that she has never smoked. She has never used smokeless tobacco. She reports that she does not drink alcohol or use illicit drugs. Where does patient live home. Can patient participate in ADLs? Yes.  No Known Allergies  Family History: History reviewed. No pertinent family history.    Prior to Admission medications   Medication Sig Start Date End Date Taking? Authorizing Provider  carbamazepine (TEGRETOL) 100 MG chewable tablet Chew 100 mg by mouth daily.  07/12/13   Historical Provider, MD  DEXILANT 60 MG capsule Take 60 mg by mouth as needed.  07/12/13   Historical Provider, MD   hydrochlorothiazide (HYDRODIURIL) 25 MG tablet Take 25 mg by mouth daily.  08/11/13   Historical Provider, MD  insulin glargine (LANTUS) 100 UNIT/ML injection Inject 16 Units into the skin 2 (two) times daily.    Historical Provider, MD  lisinopril (PRINIVIL,ZESTRIL) 40 MG tablet Take 40 mg by mouth daily.  08/08/13   Historical Provider, MD  metFORMIN (GLUCOPHAGE) 1000 MG tablet Take 1,000 mg by mouth 2 (two) times daily with a meal.  08/08/13   Historical Provider, MD  metoprolol (LOPRESSOR) 50 MG tablet Take 50 mg by mouth 2 (two) times daily.  08/11/13   Historical Provider, MD  NITROSTAT 0.4 MG SL tablet Place 0.4 mg under the tongue every 5 (five) minutes as needed.  07/12/13   Historical Provider, MD  sucralfate (CARAFATE) 1 G tablet Take 1 g by mouth 2 (two) times daily.  07/12/13   Historical Provider, MD    Physical Exam: Filed Vitals:   01/05/14 0112 01/05/14 0322 01/05/14 0454  BP: 127/63 107/50 134/68  Pulse: 73 69 70  Temp: 98.1 F (36.7 C)  98 F (36.7 C)  TempSrc: Oral  Oral  Resp: 18 18 18   Height: 5\' 7"  (1.702 m)  5\' 7"  (1.702 m)  Weight: 134.718 kg (297 lb)  134.038 kg (295 lb 8 oz)  SpO2: 99% 99% 98%     General:  Well-developed well-nourished.  Eyes: Anicteric no pallor.  ENT: No discharge from the ears eyes nose mouth.  Neck: No mass felt.  Cardiovascular: S1-S2 heard.  Respiratory: No rhonchi or crepitations.  Abdomen: Soft nontender bowel sounds present. No guarding or  rigidity.  Skin: No rash.  Musculoskeletal: Chest pain reproducible on palpation.  Psychiatric: Appears normal.  Neurologic: Alert awake oriented to time place and person. Moves all extremities but has mild weakness of left side from previous stroke.  Labs on Admission:  Basic Metabolic Panel:  Recent Labs Lab 01/05/14 0200  NA 136*  K 4.8  CL 97  CO2 24  GLUCOSE 151*  BUN 27*  CREATININE 1.00  CALCIUM 10.0   Liver Function Tests: No results found for this  basename: AST, ALT, ALKPHOS, BILITOT, PROT, ALBUMIN,  in the last 168 hours No results found for this basename: LIPASE, AMYLASE,  in the last 168 hours No results found for this basename: AMMONIA,  in the last 168 hours CBC:  Recent Labs Lab 01/05/14 0200  WBC 10.8*  NEUTROABS 5.4  HGB 11.3*  HCT 33.6*  MCV 82.4  PLT 350   Cardiac Enzymes:  Recent Labs Lab 01/05/14 0200  TROPONINI <0.30    BNP (last 3 results) No results found for this basename: PROBNP,  in the last 8760 hours CBG: No results found for this basename: GLUCAP,  in the last 168 hours  Radiological Exams on Admission: No results found.  EKG: Independently reviewed. Normal sinus rhythm. Patient has early repolarization changes in the inferior and lateral leads.  Assessment/Plan Principal Problem:   Chest pain Active Problems:   Diabetes mellitus   HTN (hypertension)   1. Chest pain - appears atypical and reproducible on palpation. I have ordered a stat chest x-ray. Since patient has cardiac risk factors including diabetes hypertension stroke and since patient's chest pain also improved with nitroglycerin sublingual at this time we will cycle her markers. Aspirin. Nitroglycerin when necessary sublingual. Patient is kept n.p.o. in anticipation of possible procedures. EKG shows some early repolarization changes in inferior and lateral leads. Check 2-D echo. 2. Diabetes mellitus type 2 -  Hold Lantus when n.p.o. Sliding-scale coverage. Hold metformin in anticipation of possible procedures. 3. Hypertension - continue present medications. 4. History of stroke - on aspirin. 5. History of seizures - on carbamazepine. 6. Anemia - Follow CBC. If no significant fall in hemoglobin then further work up as outpatient.    Code Status: Full code.  Family Communication: None.   Disposition Plan:  admit for observation.     Eduard ClosArshad N Ravon Mortellaro [facility] Pager [phone].  If 7PM-7AM, please contact  night-coverage [email or web address] Password [email or web address] 01/05/2014, 5:55 AM

## 2014-01-05 NOTE — ED Notes (Signed)
Pt c/o stabbing plain to rt chest onset 30 minutes pta,  Has had 2 ntg sl and 4 baby asa  Denies n/v, no sob

## 2014-01-06 ENCOUNTER — Encounter (HOSPITAL_COMMUNITY): Payer: Medicare Other

## 2014-01-06 DIAGNOSIS — E119 Type 2 diabetes mellitus without complications: Secondary | ICD-10-CM

## 2014-01-06 DIAGNOSIS — R079 Chest pain, unspecified: Secondary | ICD-10-CM

## 2014-01-06 DIAGNOSIS — I10 Essential (primary) hypertension: Secondary | ICD-10-CM

## 2014-01-06 LAB — GLUCOSE, CAPILLARY
GLUCOSE-CAPILLARY: 135 mg/dL — AB (ref 70–99)
GLUCOSE-CAPILLARY: 230 mg/dL — AB (ref 70–99)

## 2014-01-06 MED ORDER — LISINOPRIL 40 MG PO TABS: 40.0000 mg | ORAL_TABLET | Freq: Every day | ORAL | Status: AC

## 2014-01-06 MED ORDER — SUCRALFATE 1 G PO TABS: 1.0000 g | ORAL_TABLET | Freq: Two times a day (BID) | ORAL | Status: DC

## 2014-01-06 MED ORDER — CARBAMAZEPINE 100 MG PO CHEW: 100.0000 mg | CHEWABLE_TABLET | Freq: Every day | ORAL | Status: AC

## 2014-01-06 MED ORDER — HYDROCHLOROTHIAZIDE 25 MG PO TABS: 25.0000 mg | ORAL_TABLET | Freq: Every day | ORAL | Status: AC

## 2014-01-06 MED ORDER — METFORMIN HCL 1000 MG PO TABS: 1000.0000 mg | ORAL_TABLET | Freq: Two times a day (BID) | ORAL | Status: AC

## 2014-01-06 MED ORDER — ALBUTEROL SULFATE HFA 108 (90 BASE) MCG/ACT IN AERS: 3.0000 | INHALATION_SPRAY | Freq: Four times a day (QID) | RESPIRATORY_TRACT | Status: AC | PRN

## 2014-01-06 MED ORDER — DEXLANSOPRAZOLE 60 MG PO CPDR: 60.0000 mg | DELAYED_RELEASE_CAPSULE | Freq: Every day | ORAL | Status: AC | PRN

## 2014-01-06 MED ORDER — ASPIRIN EC 81 MG PO TBEC: 81.0000 mg | DELAYED_RELEASE_TABLET | Freq: Every day | ORAL | Status: AC

## 2014-01-06 MED ORDER — NITROGLYCERIN 0.4 MG SL SUBL: 0.4000 mg | SUBLINGUAL_TABLET | SUBLINGUAL | Status: AC | PRN

## 2014-01-06 MED ORDER — INSULIN GLARGINE 100 UNIT/ML ~~LOC~~ SOLN: 16.0000 [IU] | Freq: Two times a day (BID) | SUBCUTANEOUS | Status: AC

## 2014-01-06 MED ORDER — METOPROLOL TARTRATE 50 MG PO TABS: 50.0000 mg | ORAL_TABLET | Freq: Two times a day (BID) | ORAL | Status: AC

## 2014-01-06 MED ORDER — TECHNETIUM TC 99M SESTAMIBI - CARDIOLITE
30.0000 | Freq: Once | INTRAVENOUS | Status: AC | PRN
  Administered 2014-01-06: 30 via INTRAVENOUS

## 2014-01-06 MED ORDER — TECHNETIUM TC 99M SESTAMIBI GENERIC - CARDIOLITE
30.0000 | Freq: Once | INTRAVENOUS | Status: AC | PRN
  Administered 2014-01-05: 30 via INTRAVENOUS

## 2014-01-06 NOTE — Progress Notes (Signed)
CSW Proofreader(Clinical Social Worker) spoke with pt about transportation issues. Pt reported having no friends or family available in the area to pick her up. [place]. CSW informed pt that only options would be to do non-emergent ambulance which would not be covered by insurance or CSW could provide pt with bus pass and fare to [place]. Pt very concerned because she does not know the bus route. Pt mentioned having a friend that was in the area today that could possibly help if she would be discharged by 1:30pm. CSW told pt to work on this plan and CSW would inform nurse to try and help speed up discharge if possible.  Emerick Weatherly, LCSWA [phone]

## 2014-01-06 NOTE — Progress Notes (Signed)
01/05/14 1605  PT G-Codes **NOT FOR INPATIENT CLASS**  Functional Assessment Tool Used assist level  Functional Limitation Mobility: Walking and moving around  Mobility: Walking and Moving Around Current Status (Z6109(G8978) CI  Mobility: Walking and Moving Around Goal Status (U0454(G8979) CH  late entry g-coded. Rollene Rotundaebecca B. Chala Gul, PT, DPT [phone]

## 2014-01-06 NOTE — Discharge Summary (Addendum)
Physician Discharge Summary  Sarah GainsGlinda Rezendes NWG:[id number] DOB: 01-01-1956 DOA: 01/05/2014  PCP: No PCP Per Patient  Admit date: 01/05/2014 Discharge date: 01/06/2014  Time spent: 35 minutes  Recommendations for Outpatient Follow-up:  1. Need further care for chronic medical problems.  2. If chest pain persist might need GI evaluation. 3. Repeat CBC.   Discharge Diagnoses:    Chest pain   Diabetes mellitus   HTN (hypertension)   Discharge Condition: Stable  Diet recommendation: Carb modified.   Filed Weights   01/05/14 0112 01/05/14 0454  Weight: 134.718 kg (297 lb) 134.038 kg (295 lb 8 oz)    History of present illness:  Sarah Ewing is a 58 y.o. female with history of diabetes mellitus, hypertension, stroke, seizures presented to the ER because of chest pain. Patient [place] recently moved [place]. Patient was talking on the phone when patient suddenly experienced a sharp pain on the right side of the chest. Patient took some sublingual nitroglycerin which helped the pain but had to take one more. Patient had mild shortness of breath. Patient called EMS and was brought to the ER. Cardiac markers EKG were negative. Since patient has cardiac risk factors and has had improvement with nitroglycerin patient has been admitted for further management. Patient denies any nausea vomiting abdominal pain. Has had cardiac catheter previously.    Hospital Course:  1. Chest pain - appears atypical and reproducible on palpation. Chest x ray negative. Troponin times 3 negative. Myoview low risk. Resume protonix, Carafate.  2. Diabetes mellitus type 2 -resume medications at discharge.  3. Hypertension - continue present medications. 4. History of stroke - on aspirin. 5. History of seizures - on carbamazepine. 6. Anemia - out patient work up.  7. Leukocytosis; UA negative, chest x ray negative. Afebrile.   Procedures: Myoview; Lexiscan Sestamibi: Clinically and electrically negative  Sestamibi  scan with probable normal perfusion and minimal soft tissue  attenuation. No significant ischemia or scar. LVEF calculated at  62%.     Consultations:  cardiology  Discharge Exam: Filed Vitals:   01/06/14 0531  BP: 107/67  Pulse: 72  Temp: 97.5 F (36.4 C)  Resp: 18    General: no distress.  Cardiovascular: S 1, S 2 RRR Respiratory: CTA  Discharge Instructions You were cared for by a hospitalist during your hospital stay. If you have any questions about your discharge medications or the care you received while you were in the hospital after you are discharged, you can call the unit and asked to speak with the hospitalist on call if the hospitalist that took care of you is not available. Once you are discharged, your primary care physician will handle any further medical issues. Please note that NO REFILLS for any discharge medications will be authorized once you are discharged, as it is imperative that you return to your primary care physician (or establish a relationship with a primary care physician if you do not have one) for your aftercare needs so that they can reassess your need for medications and monitor your lab values.  Discharge Orders   Future Orders Complete By Expires   Diet - low sodium heart healthy  As directed    Increase activity slowly  As directed        Medication List         albuterol 108 (90 BASE) MCG/ACT inhaler  Commonly known as:  PROVENTIL HFA;VENTOLIN HFA  Inhale 3-4 puffs into the lungs every 6 (six) hours as needed for  wheezing or shortness of breath.     aspirin EC 81 MG tablet  Take 1 tablet (81 mg total) by mouth daily.     carbamazepine 100 MG chewable tablet  Commonly known as:  TEGRETOL  Chew 100 mg by mouth daily.     cholecalciferol 1000 UNITS tablet  Commonly known as:  VITAMIN D  Take 1,000 Units by mouth daily.     DEXILANT 60 MG capsule  Generic drug:  dexlansoprazole  Take 60 mg by mouth daily as needed  (herena).     hydrochlorothiazide 25 MG tablet  Commonly known as:  HYDRODIURIL  Take 25 mg by mouth daily.     insulin glargine 100 UNIT/ML injection  Commonly known as:  LANTUS  Inject 16 Units into the skin 2 (two) times daily.     lisinopril 40 MG tablet  Commonly known as:  PRINIVIL,ZESTRIL  Take 40 mg by mouth daily.     metFORMIN 1000 MG tablet  Commonly known as:  GLUCOPHAGE  Take 1,000 mg by mouth 2 (two) times daily with a meal.     metoprolol 50 MG tablet  Commonly known as:  LOPRESSOR  Take 50 mg by mouth 2 (two) times daily.     NITROSTAT 0.4 MG SL tablet  Generic drug:  nitroGLYCERIN  Place 0.4 mg under the tongue every 5 (five) minutes as needed for chest pain.     sucralfate 1 G tablet  Commonly known as:  CARAFATE  Take 1 tablet (1 g total) by mouth 2 (two) times daily.       No Known Allergies     Follow-up Information   Follow up with [facility]. Counsellor. )    Solicitor information:   [address] [place] [address] [phone]       Follow up with No PCP Per Patient.   Specialty:  General Practice       The results of significant diagnostics from this hospitalization (including imaging, microbiology, ancillary and laboratory) are listed below for reference.    Significant Diagnostic Studies: Dg Chest Port 1 View  01/05/2014   CLINICAL DATA:  Shortness of breath.  EXAM: PORTABLE CHEST - 1 VIEW  COMPARISON:  03/09/2011  FINDINGS: Low lung volumes. Cardiac silhouette is increased due to portable AP technique. Mild vascular congestion. No infiltrates or failure.  IMPRESSION: No No definite active infiltrates. Low lung volumes accentuate the pulmonary markings.   Electronically Signed   By: Davonna Belling M.D.   On: 01/05/2014 07:37    Microbiology: No results found for this or any previous visit (from the past 240 hour(s)).   Labs: Basic Metabolic Panel:  Recent Labs Lab 01/05/14 0200  01/05/14 0640  NA 136* 137  K 4.8 4.0  CL 97 97  CO2 24 20  GLUCOSE 151* 136*  BUN 27* 24*  CREATININE 1.00 0.86  CALCIUM 10.0 9.8   Liver Function Tests:  Recent Labs Lab 01/05/14 0640  AST 28  ALT 34  ALKPHOS 63  BILITOT <0.2*  PROT 7.0  ALBUMIN 4.0   No results found for this basename: LIPASE, AMYLASE,  in the last 168 hours No results found for this basename: AMMONIA,  in the last 168 hours CBC:  Recent Labs Lab 01/05/14 0200 01/05/14 0640  WBC 10.8* 11.1*  NEUTROABS 5.4 5.1  HGB 11.3* 11.0*  HCT 33.6* 33.6*  MCV 82.4 81.4  PLT 350 335  Cardiac Enzymes:  Recent Labs Lab 01/05/14 0200 01/05/14 0640 01/05/14 1312 01/05/14 1956  TROPONINI <0.30 <0.30 <0.30 <0.30   BNP: BNP (last 3 results) No results found for this basename: PROBNP,  in the last 8760 hours CBG:  Recent Labs Lab 01/05/14 1152 01/05/14 1657 01/05/14 2149 01/06/14 0738 01/06/14 1125  GLUCAP 170* 157* 147* 135* 230*       Signed:  Cannon Quinton A Loyce Klasen  [facility] 01/06/2014, 12:55 PM

## 2014-07-30 ENCOUNTER — Emergency Department (HOSPITAL_BASED_OUTPATIENT_CLINIC_OR_DEPARTMENT_OTHER): Payer: Medicare Other

## 2014-07-30 ENCOUNTER — Emergency Department (HOSPITAL_BASED_OUTPATIENT_CLINIC_OR_DEPARTMENT_OTHER)
Admission: EM | Admit: 2014-07-30 | Discharge: 2014-07-30 | Disposition: A | Discharge status: Home / Self Care | Payer: Medicare Other | Attending: Emergency Medicine | Admitting: Emergency Medicine

## 2014-07-30 ENCOUNTER — Encounter (HOSPITAL_BASED_OUTPATIENT_CLINIC_OR_DEPARTMENT_OTHER): Payer: Self-pay | Admitting: Emergency Medicine

## 2014-07-30 DIAGNOSIS — Z79899 Other long term (current) drug therapy: Secondary | ICD-10-CM | POA: Insufficient documentation

## 2014-07-30 DIAGNOSIS — Z8673 Personal history of transient ischemic attack (TIA), and cerebral infarction without residual deficits: Secondary | ICD-10-CM | POA: Insufficient documentation

## 2014-07-30 DIAGNOSIS — Z7982 Long term (current) use of aspirin: Secondary | ICD-10-CM | POA: Diagnosis not present

## 2014-07-30 DIAGNOSIS — H538 Other visual disturbances: Secondary | ICD-10-CM | POA: Diagnosis not present

## 2014-07-30 DIAGNOSIS — R51 Headache: Secondary | ICD-10-CM | POA: Diagnosis present

## 2014-07-30 DIAGNOSIS — I209 Angina pectoris, unspecified: Secondary | ICD-10-CM | POA: Diagnosis not present

## 2014-07-30 DIAGNOSIS — I509 Heart failure, unspecified: Secondary | ICD-10-CM | POA: Insufficient documentation

## 2014-07-30 DIAGNOSIS — E119 Type 2 diabetes mellitus without complications: Secondary | ICD-10-CM | POA: Diagnosis not present

## 2014-07-30 DIAGNOSIS — I10 Essential (primary) hypertension: Secondary | ICD-10-CM | POA: Diagnosis not present

## 2014-07-30 DIAGNOSIS — Z794 Long term (current) use of insulin: Secondary | ICD-10-CM | POA: Diagnosis not present

## 2014-07-30 DIAGNOSIS — R519 Headache, unspecified: Secondary | ICD-10-CM

## 2014-07-30 MED ORDER — PROMETHAZINE HCL 25 MG/ML IJ SOLN
25.0000 mg | Freq: Once | INTRAMUSCULAR | Status: AC
  Administered 2014-07-30: 25 mg via INTRAMUSCULAR
  Filled 2014-07-30: qty 1

## 2014-07-30 MED ORDER — KETOROLAC TROMETHAMINE 60 MG/2ML IM SOLN
60.0000 mg | Freq: Once | INTRAMUSCULAR | Status: AC
  Administered 2014-07-30: 60 mg via INTRAMUSCULAR
  Filled 2014-07-30: qty 2

## 2014-07-30 NOTE — ED Provider Notes (Signed)
CSN: [id number]     Arrival date & time 07/30/14  0845 History   First MD Initiated Contact with Patient 07/30/14 0857     Chief Complaint  Patient presents with  . Headache     (Consider location/radiation/quality/duration/timing/severity/associated sxs/prior Treatment) HPI Comments: Patient is a 58 year old female with history of CVA 7 years ago. She presents today with complaints of pain to the left side of her head. She states the pain starts in the left forehead and radiates to the back of her neck. She reports blurry vision. She denies vomiting or nausea. She denies any injury or trauma.  Patient is a 58 y.o. female presenting with headaches. The history is provided by the patient.  Headache Pain location:  L temporal and L parietal Quality:  Dull Radiates to:  Does not radiate Onset quality:  Sudden Duration:  12 hours Timing:  Constant Progression:  Worsening Chronicity:  New Similar to prior headaches: no   Relieved by:  Nothing Worsened by:  Nothing tried   Past Medical History  Diagnosis Date  . Stroke   . Hypertension   . Diabetes mellitus without complication   . CHF (congestive heart failure)   . Angina at rest    Past Surgical History  Procedure Laterality Date  . Cholecystectomy    . Abdominal hysterectomy     History reviewed. No pertinent family history. History  Substance Use Topics  . Smoking status: Never Smoker   . Smokeless tobacco: Never Used  . Alcohol Use: No   OB History    No data available     Review of Systems  Neurological: Positive for headaches.  All other systems reviewed and are negative.     Allergies  Review of patient's allergies indicates no known allergies.  Home Medications   Prior to Admission medications   Medication Sig Start Date End Date Taking? Authorizing Provider  albuterol (PROVENTIL HFA;VENTOLIN HFA) 108 (90 BASE) MCG/ACT inhaler Inhale 3-4 puffs into the lungs every 6 (six) hours as needed for  wheezing or shortness of breath. 01/06/14   Belkys A Regalado, MD  aspirin EC 81 MG tablet Take 1 tablet (81 mg total) by mouth daily. 01/06/14   Belkys A Regalado, MD  carbamazepine (TEGRETOL) 100 MG chewable tablet Chew 1 tablet (100 mg total) by mouth daily. 01/06/14   Belkys A Regalado, MD  cholecalciferol (VITAMIN D) 1000 UNITS tablet Take 1,000 Units by mouth daily.    Historical Provider, MD  dexlansoprazole (DEXILANT) 60 MG capsule Take 1 capsule (60 mg total) by mouth daily as needed (herena). 01/06/14   Belkys A Regalado, MD  hydrochlorothiazide (HYDRODIURIL) 25 MG tablet Take 1 tablet (25 mg total) by mouth daily. 01/06/14   Belkys A Regalado, MD  insulin glargine (LANTUS) 100 UNIT/ML injection Inject 0.16 mLs (16 Units total) into the skin 2 (two) times daily. 01/06/14   Belkys A Regalado, MD  lisinopril (PRINIVIL,ZESTRIL) 40 MG tablet Take 1 tablet (40 mg total) by mouth daily. 01/06/14   Belkys A Regalado, MD  metFORMIN (GLUCOPHAGE) 1000 MG tablet Take 1 tablet (1,000 mg total) by mouth 2 (two) times daily with a meal. 01/06/14   Belkys A Regalado, MD  metoprolol (LOPRESSOR) 50 MG tablet Take 1 tablet (50 mg total) by mouth 2 (two) times daily. 01/06/14   Belkys A Regalado, MD  nitroGLYCERIN (NITROSTAT) 0.4 MG SL tablet Place 1 tablet (0.4 mg total) under the tongue every 5 (five) minutes as needed for chest pain. 01/06/14  Belkys A Regalado, MD  sucralfate (CARAFATE) 1 G tablet Take 1 tablet (1 g total) by mouth 2 (two) times daily. 01/06/14   Belkys A Regalado, MD   BP 152/96 mmHg  Pulse 70  Temp(Src) 98.1 F (36.7 C) (Oral)  Resp 16  Ht 5\' 7"  (1.702 m)  Wt 302 lb (136.986 kg)  BMI 47.29 kg/m2  SpO2 97% Physical Exam  Constitutional: She is oriented to person, place, and time. She appears well-developed and well-nourished. No distress.  HENT:  Head: Normocephalic and atraumatic.  Mouth/Throat: Oropharynx is clear and moist.  Eyes: EOM are normal. Pupils are equal, round, and reactive  to light.  Neck: Normal range of motion. Neck supple.  Cardiovascular: Normal rate and regular rhythm.  Exam reveals no gallop and no friction rub.   No murmur heard. Pulmonary/Chest: Effort normal and breath sounds normal. No respiratory distress. She has no wheezes.  Abdominal: Soft. Bowel sounds are normal. She exhibits no distension. There is no tenderness.  Musculoskeletal: Normal range of motion.  Neurological: She is alert and oriented to person, place, and time. No cranial nerve deficit. She exhibits normal muscle tone. Coordination normal.  Skin: Skin is warm and dry. She is not diaphoretic.  Nursing note and vitals reviewed.   ED Course  Procedures (including critical care time) Labs Review Labs Reviewed - No data to display  Imaging Review No results found.   EKG Interpretation None      MDM   Final diagnoses:  Headache    Patient presents with complaints of left-sided headache. She is neurologically intact, CT scan is negative, and is feeling better with medications given. I do not feel as though there is an emergent cause and believe she is appropriate for discharge. She is to continue taking ibuprofen as needed and return to the ER if she experiences any problems.    Geoffery Lyonsouglas Tarun Patchell, MD 07/30/14 1013

## 2014-07-30 NOTE — ED Notes (Signed)
Pt sts headache to left side headache starting in left forehead and radiating to left posterior head to neck. Pt sts blurred vision onset same time. Pt denies sinus pain, fever, n/v/d. Also mentions feeling "cramp" in neck. Pt reports stroke 7 years ago. Denies hx of migraines

## 2014-07-30 NOTE — Discharge Instructions (Signed)
Ibuprofen 600 mg every 6 hours as needed for pain.  Return to the emergency department if you experience any new or bothersome symptoms.   General Headache Without Cause A headache is pain or discomfort felt around the head or neck area. The specific cause of a headache may not be found. There are many causes and types of headaches. A few common ones are:  Tension headaches.  Migraine headaches.  Cluster headaches.  Chronic daily headaches. HOME CARE INSTRUCTIONS   Keep all follow-up appointments with your caregiver or any specialist referral.  Only take over-the-counter or prescription medicines for pain or discomfort as directed by your caregiver.  Lie down in a dark, quiet room when you have a headache.  Keep a headache journal to find out what may trigger your migraine headaches. For example, write down:  What you eat and drink.  How much sleep you get.  Any change to your diet or medicines.  Try massage or other relaxation techniques.  Put ice packs or heat on the head and neck. Use these 3 to 4 times per day for 15 to 20 minutes each time, or as needed.  Limit stress.  Sit up straight, and do not tense your muscles.  Quit smoking if you smoke.  Limit alcohol use.  Decrease the amount of caffeine you drink, or stop drinking caffeine.  Eat and sleep on a regular schedule.  Get 7 to 9 hours of sleep, or as recommended by your caregiver.  Keep lights dim if bright lights bother you and make your headaches worse. SEEK MEDICAL CARE IF:   You have problems with the medicines you were prescribed.  Your medicines are not working.  You have a change from the usual headache.  You have nausea or vomiting. SEEK IMMEDIATE MEDICAL CARE IF:   Your headache becomes severe.  You have a fever.  You have a stiff neck.  You have loss of vision.  You have muscular weakness or loss of muscle control.  You start losing your balance or have trouble walking.  You  feel faint or pass out.  You have severe symptoms that are different from your first symptoms. MAKE SURE YOU:   Understand these instructions.  Will watch your condition.  Will get help right away if you are not doing well or get worse. Document Released: 09/08/2005 Document Revised: 12/01/2011 Document Reviewed: 09/24/2011 [facility] Patient Information 2015 [employer] [place]. This information is not intended to replace advice given to you by your health care provider. Make sure you discuss any questions you have with your health care provider.

## 2014-07-30 NOTE — ED Notes (Signed)
Pt reports blurred vision

## 2014-11-09 ENCOUNTER — Emergency Department (HOSPITAL_BASED_OUTPATIENT_CLINIC_OR_DEPARTMENT_OTHER)
Admission: EM | Admit: 2014-11-09 | Discharge: 2014-11-09 | Disposition: A | Discharge status: Home / Self Care | Payer: Medicare Other | Attending: Emergency Medicine | Admitting: Emergency Medicine

## 2014-11-09 ENCOUNTER — Encounter (HOSPITAL_BASED_OUTPATIENT_CLINIC_OR_DEPARTMENT_OTHER): Payer: Self-pay

## 2014-11-09 DIAGNOSIS — Z8673 Personal history of transient ischemic attack (TIA), and cerebral infarction without residual deficits: Secondary | ICD-10-CM | POA: Insufficient documentation

## 2014-11-09 DIAGNOSIS — Z7982 Long term (current) use of aspirin: Secondary | ICD-10-CM | POA: Insufficient documentation

## 2014-11-09 DIAGNOSIS — Z90710 Acquired absence of both cervix and uterus: Secondary | ICD-10-CM | POA: Diagnosis not present

## 2014-11-09 DIAGNOSIS — Z9049 Acquired absence of other specified parts of digestive tract: Secondary | ICD-10-CM | POA: Diagnosis not present

## 2014-11-09 DIAGNOSIS — Z794 Long term (current) use of insulin: Secondary | ICD-10-CM | POA: Insufficient documentation

## 2014-11-09 DIAGNOSIS — Z79899 Other long term (current) drug therapy: Secondary | ICD-10-CM | POA: Diagnosis not present

## 2014-11-09 DIAGNOSIS — E119 Type 2 diabetes mellitus without complications: Secondary | ICD-10-CM | POA: Insufficient documentation

## 2014-11-09 DIAGNOSIS — K2970 Gastritis, unspecified, without bleeding: Secondary | ICD-10-CM | POA: Insufficient documentation

## 2014-11-09 DIAGNOSIS — I509 Heart failure, unspecified: Secondary | ICD-10-CM | POA: Insufficient documentation

## 2014-11-09 DIAGNOSIS — I10 Essential (primary) hypertension: Secondary | ICD-10-CM | POA: Diagnosis not present

## 2014-11-09 DIAGNOSIS — R1013 Epigastric pain: Secondary | ICD-10-CM | POA: Diagnosis present

## 2014-11-09 LAB — COMPREHENSIVE METABOLIC PANEL
ALK PHOS: 58 U/L (ref 39–117)
ALT: 47 U/L — AB (ref 0–35)
AST: 47 U/L — ABNORMAL HIGH (ref 0–37)
Albumin: 4.5 g/dL (ref 3.5–5.2)
Anion gap: 12 (ref 5–15)
BUN: 21 mg/dL (ref 6–23)
CO2: 26 mmol/L (ref 19–32)
Calcium: 9.6 mg/dL (ref 8.4–10.5)
Chloride: 99 mmol/L (ref 96–112)
Creatinine, Ser: 0.94 mg/dL (ref 0.50–1.10)
GFR calc Af Amer: 75 mL/min — ABNORMAL LOW (ref >90)
GFR calc non Af Amer: 65 mL/min — ABNORMAL LOW (ref >90)
GLUCOSE: 234 mg/dL — AB (ref 70–99)
POTASSIUM: 3.7 mmol/L (ref 3.5–5.1)
SODIUM: 137 mmol/L (ref 135–145)
TOTAL PROTEIN: 7.8 g/dL (ref 6.0–8.3)
Total Bilirubin: 0.3 mg/dL (ref 0.3–1.2)

## 2014-11-09 LAB — CBC WITH DIFFERENTIAL/PLATELET
BASOS ABS: 0 10*3/uL (ref 0.0–0.1)
Basophils Relative: 0 % (ref 0–1)
Eosinophils Absolute: 0.4 10*3/uL (ref 0.0–0.7)
Eosinophils Relative: 4 % (ref 0–5)
HCT: 39.1 % (ref 36.0–46.0)
Hemoglobin: 13 g/dL (ref 12.0–15.0)
LYMPHS PCT: 34 % (ref 12–46)
Lymphs Abs: 3.9 10*3/uL (ref 0.7–4.0)
MCH: 26.6 pg (ref 26.0–34.0)
MCHC: 33.2 g/dL (ref 30.0–36.0)
MCV: 80 fL (ref 78.0–100.0)
MONO ABS: 0.8 10*3/uL (ref 0.1–1.0)
Monocytes Relative: 7 % (ref 3–12)
NEUTROS ABS: 6.4 10*3/uL (ref 1.7–7.7)
Neutrophils Relative %: 55 % (ref 43–77)
Platelets: 422 10*3/uL — ABNORMAL HIGH (ref 150–400)
RBC: 4.89 MIL/uL (ref 3.87–5.11)
RDW: 14 % (ref 11.5–15.5)
WBC: 11.4 10*3/uL — ABNORMAL HIGH (ref 4.0–10.5)

## 2014-11-09 LAB — URINALYSIS, ROUTINE W REFLEX MICROSCOPIC
Bilirubin Urine: NEGATIVE
Glucose, UA: NEGATIVE mg/dL
Hgb urine dipstick: NEGATIVE
Ketones, ur: NEGATIVE mg/dL
Leukocytes, UA: NEGATIVE
Nitrite: NEGATIVE
Protein, ur: NEGATIVE mg/dL
SPECIFIC GRAVITY, URINE: 1.022 (ref 1.005–1.030)
UROBILINOGEN UA: 0.2 mg/dL (ref 0.0–1.0)
pH: 5.5 (ref 5.0–8.0)

## 2014-11-09 LAB — LIPASE, BLOOD: Lipase: 52 U/L (ref 11–59)

## 2014-11-09 MED ORDER — PANTOPRAZOLE SODIUM 40 MG IV SOLR
40.0000 mg | Freq: Once | INTRAVENOUS | Status: AC
  Administered 2014-11-09: 40 mg via INTRAVENOUS
  Filled 2014-11-09: qty 40

## 2014-11-09 MED ORDER — SUCRALFATE 1 G PO TABS: 1.0000 g | ORAL_TABLET | Freq: Three times a day (TID) | ORAL | Status: AC

## 2014-11-09 MED ORDER — GI COCKTAIL ~~LOC~~
30.0000 mL | Freq: Once | ORAL | Status: AC
  Administered 2014-11-09: 30 mL via ORAL
  Filled 2014-11-09: qty 30

## 2014-11-09 NOTE — ED Notes (Signed)
Pt ambulated to restroom at this time.

## 2014-11-09 NOTE — ED Provider Notes (Signed)
CSN: [id number]     Arrival date & time 11/09/14  1031 History   First MD Initiated Contact with Patient 11/09/14 1205     Chief Complaint  Patient presents with  . Abdominal Pain     (Consider location/radiation/quality/duration/timing/severity/associated sxs/prior Treatment) HPI Comments: Patient complains of pain in her epigastrium. She does have a history of peptic ulcer disease. She started a new medicine, Victoza, about the time that she started having epigastric pain and she feels it's related. She has ongoing constant achy pain that's nonradiating. She denies any nausea or vomiting. She denies any urinary symptoms. She denies any change in her bowel habits. She denies he fevers or chills. She states that she's already had her gallbladder taken out.  Patient is a 59 y.o. female presenting with abdominal pain.  Abdominal Pain Associated symptoms: no chest pain, no chills, no cough, no diarrhea, no fatigue, no fever, no hematuria, no nausea, no shortness of breath and no vomiting     Past Medical History  Diagnosis Date  . Stroke   . Hypertension   . Diabetes mellitus without complication   . CHF (congestive heart failure)   . Angina at rest    Past Surgical History  Procedure Laterality Date  . Cholecystectomy    . Abdominal hysterectomy     No family history on file. History  Substance Use Topics  . Smoking status: Never Smoker   . Smokeless tobacco: Never Used  . Alcohol Use: No   OB History    No data available     Review of Systems  Constitutional: Negative for fever, chills, diaphoresis and fatigue.  HENT: Negative for congestion, rhinorrhea and sneezing.   Eyes: Negative.   Respiratory: Negative for cough, chest tightness and shortness of breath.   Cardiovascular: Negative for chest pain and leg swelling.  Gastrointestinal: Positive for abdominal pain. Negative for nausea, vomiting, diarrhea and blood in stool.  Genitourinary: Negative for frequency,  hematuria, flank pain and difficulty urinating.  Musculoskeletal: Negative for back pain and arthralgias.  Skin: Negative for rash.  Neurological: Negative for dizziness, speech difficulty, weakness, numbness and headaches.      Allergies  Review of patient's allergies indicates no known allergies.  Home Medications   Prior to Admission medications   Medication Sig Start Date End Date Taking? Authorizing Provider  albuterol (PROVENTIL HFA;VENTOLIN HFA) 108 (90 BASE) MCG/ACT inhaler Inhale 3-4 puffs into the lungs every 6 (six) hours as needed for wheezing or shortness of breath. 01/06/14   Belkys A Regalado, MD  aspirin EC 81 MG tablet Take 1 tablet (81 mg total) by mouth daily. 01/06/14   Belkys A Regalado, MD  carbamazepine (TEGRETOL) 100 MG chewable tablet Chew 1 tablet (100 mg total) by mouth daily. 01/06/14   Belkys A Regalado, MD  cholecalciferol (VITAMIN D) 1000 UNITS tablet Take 1,000 Units by mouth daily.    Historical Provider, MD  dexlansoprazole (DEXILANT) 60 MG capsule Take 1 capsule (60 mg total) by mouth daily as needed (herena). 01/06/14   Belkys A Regalado, MD  hydrochlorothiazide (HYDRODIURIL) 25 MG tablet Take 1 tablet (25 mg total) by mouth daily. 01/06/14   Belkys A Regalado, MD  insulin glargine (LANTUS) 100 UNIT/ML injection Inject 0.16 mLs (16 Units total) into the skin 2 (two) times daily. 01/06/14   Belkys A Regalado, MD  lisinopril (PRINIVIL,ZESTRIL) 40 MG tablet Take 1 tablet (40 mg total) by mouth daily. 01/06/14   Belkys A Regalado, MD  metFORMIN (GLUCOPHAGE) 1000  MG tablet Take 1 tablet (1,000 mg total) by mouth 2 (two) times daily with a meal. 01/06/14   Belkys A Regalado, MD  metoprolol (LOPRESSOR) 50 MG tablet Take 1 tablet (50 mg total) by mouth 2 (two) times daily. 01/06/14   Belkys A Regalado, MD  nitroGLYCERIN (NITROSTAT) 0.4 MG SL tablet Place 1 tablet (0.4 mg total) under the tongue every 5 (five) minutes as needed for chest pain. 01/06/14   Belkys A Regalado,  MD  sucralfate (CARAFATE) 1 G tablet Take 1 tablet (1 g total) by mouth 4 (four) times daily -  with meals and at bedtime. 11/09/14   Rolan Bucco, MD   BP 112/88 mmHg  Pulse 87  Temp(Src) 98.1 F (36.7 C) (Oral)  Resp 16  Ht  (1.702 m)  Wt 296 lb (134.265 kg)  BMI 46.35 kg/m2  SpO2 99% Physical Exam  Constitutional: She is oriented to person, place, and time. She appears well-developed and well-nourished.  HENT:  Head: Normocephalic and atraumatic.  Eyes: Pupils are equal, round, and reactive to light.  Neck: Normal range of motion. Neck supple.  Cardiovascular: Normal rate, regular rhythm and normal heart sounds.   Pulmonary/Chest: Effort normal and breath sounds normal. No respiratory distress. She has no wheezes. She has no rales. She exhibits no tenderness.  Abdominal: Soft. Bowel sounds are normal. There is tenderness (positive tenderness in the epigastrium). There is no rebound and no guarding.  Musculoskeletal: Normal range of motion. She exhibits no edema.  Lymphadenopathy:    She has no cervical adenopathy.  Neurological: She is alert and oriented to person, place, and time.  Skin: Skin is warm and dry. No rash noted.  Psychiatric: She has a normal mood and affect.    ED Course  Procedures (including critical care time) Labs Review Results for orders placed or performed during the hospital encounter of 11/09/14  CBC WITH DIFFERENTIAL  Result Value Ref Range   WBC 11.4 (H) 4.0 - 10.5 K/uL   RBC 4.89 3.87 - 5.11 MIL/uL   Hemoglobin 13.0 12.0 - 15.0 g/dL   HCT 16.1 09.6 - 04.5 %   MCV 80.0 78.0 - 100.0 fL   MCH 26.6 26.0 - 34.0 pg   MCHC 33.2 30.0 - 36.0 g/dL   RDW 40.9 81.1 - 91.4 %   Platelets 422 (H) 150 - 400 K/uL   Neutrophils Relative % 55 43 - 77 %   Neutro Abs 6.4 1.7 - 7.7 K/uL   Lymphocytes Relative 34 12 - 46 %   Lymphs Abs 3.9 0.7 - 4.0 K/uL   Monocytes Relative 7 3 - 12 %   Monocytes Absolute 0.8 0.1 - 1.0 K/uL   Eosinophils Relative 4 0 - 5  %   Eosinophils Absolute 0.4 0.0 - 0.7 K/uL   Basophils Relative 0 0 - 1 %   Basophils Absolute 0.0 0.0 - 0.1 K/uL  Comprehensive metabolic panel  Result Value Ref Range   Sodium 137 135 - 145 mmol/L   Potassium 3.7 3.5 - 5.1 mmol/L   Chloride 99 96 - 112 mmol/L   CO2 26 19 - 32 mmol/L   Glucose, Bld 234 (H) 70 - 99 mg/dL   BUN 21 6 - 23 mg/dL   Creatinine, Ser 7.82 0.50 - 1.10 mg/dL   Calcium 9.6 8.4 - 95.6 mg/dL   Total Protein 7.8 6.0 - 8.3 g/dL   Albumin 4.5 3.5 - 5.2 g/dL   AST 47 (H) 0 - 37  U/L   ALT 47 (H) 0 - 35 U/L   Alkaline Phosphatase 58 39 - 117 U/L   Total Bilirubin 0.3 0.3 - 1.2 mg/dL   GFR calc non Af Amer 65 (L) >90 mL/min   GFR calc Af Amer 75 (L) >90 mL/min   Anion gap 12 5 - 15  Urinalysis with microscopic  Result Value Ref Range   Color, Urine YELLOW YELLOW   APPearance CLEAR CLEAR   Specific Gravity, Urine 1.022 1.005 - 1.030   pH 5.5 5.0 - 8.0   Glucose, UA NEGATIVE NEGATIVE mg/dL   Hgb urine dipstick NEGATIVE NEGATIVE   Bilirubin Urine NEGATIVE NEGATIVE   Ketones, ur NEGATIVE NEGATIVE mg/dL   Protein, ur NEGATIVE NEGATIVE mg/dL   Urobilinogen, UA 0.2 0.0 - 1.0 mg/dL   Nitrite NEGATIVE NEGATIVE   Leukocytes, UA NEGATIVE NEGATIVE  Lipase, blood  Result Value Ref Range   Lipase 52 11 - 59 U/L   No results found.    Imaging Review No results found.   EKG Interpretation   Date/Time:  Thursday November 09 2014 10:51:00 EST Ventricular Rate:  82 PR Interval:  178 QRS Duration: 88 QT Interval:  378 QTC Calculation: 441 R Axis:   19 Text Interpretation:  Normal sinus rhythm Minimal voltage criteria for  LVH, may be normal variant Borderline ECG since last tracing no  significant change Confirmed by Earnestine Tuohey  MD, Anushree Dorsi ([id number]) on 11/09/2014  12:22:01 PM      MDM   Final diagnoses:  Gastritis    Patient is feeling much better after GI cocktail. Her pain is almost completely resolved. She has already had a cholecystectomy site don't  feel this point that further imaging studies was warranted. She has no other abdominal pain other than the pain in the epigastrium. Her lipase is normal which would not indicate pancreatitis. She has some very mild elevation in her LFTs. Advised her to discuss with her doctor switching to a different diabetes medication. I encouraged her to take her Dexilant which she's not been taking regularly and I gave her prescription for Carafate to start.    Rolan Bucco, MD 11/09/14 [phone]

## 2014-11-09 NOTE — ED Notes (Signed)
Abdominal pain x 10 days.  Symptoms are worsening.  Started on Victoza 2 weeks ago and she feels this is related.

## 2014-11-09 NOTE — Discharge Instructions (Signed)
Make sure that you take your Dexilant as prescribed.  Gastritis, Adult Gastritis is soreness and swelling (inflammation) of the lining of the stomach. Gastritis can develop as a sudden onset (acute) or long-term (chronic) condition. If gastritis is not treated, it can lead to stomach bleeding and ulcers. CAUSES  Gastritis occurs when the stomach lining is weak or damaged. Digestive juices from the stomach then inflame the weakened stomach lining. The stomach lining may be weak or damaged due to viral or bacterial infections. One common bacterial infection is the Helicobacter pylori infection. Gastritis can also result from excessive alcohol consumption, taking certain medicines, or having too much acid in the stomach.  SYMPTOMS  In some cases, there are no symptoms. When symptoms are present, they may include:  Pain or a burning sensation in the upper abdomen.  Nausea.  Vomiting.  An uncomfortable feeling of fullness after eating. DIAGNOSIS  Your caregiver may suspect you have gastritis based on your symptoms and a physical exam. To determine the cause of your gastritis, your caregiver may perform the following:  Blood or stool tests to check for the H pylori bacterium.  Gastroscopy. A thin, flexible tube (endoscope) is passed down the esophagus and into the stomach. The endoscope has a light and camera on the end. Your caregiver uses the endoscope to view the inside of the stomach.  Taking a tissue sample (biopsy) from the stomach to examine under a microscope. TREATMENT  Depending on the cause of your gastritis, medicines may be prescribed. If you have a bacterial infection, such as an H pylori infection, antibiotics may be given. If your gastritis is caused by too much acid in the stomach, H2 blockers or antacids may be given. Your caregiver may recommend that you stop taking aspirin, ibuprofen, or other nonsteroidal anti-inflammatory drugs (NSAIDs). HOME CARE INSTRUCTIONS  Only take  over-the-counter or prescription medicines as directed by your caregiver.  If you were given antibiotic medicines, take them as directed. Finish them even if you start to feel better.  Drink enough fluids to keep your urine clear or pale yellow.  Avoid foods and drinks that make your symptoms worse, such as:  Caffeine or alcoholic drinks.  Chocolate.  Peppermint or mint flavorings.  Garlic and onions.  Spicy foods.  Citrus fruits, such as oranges, lemons, or limes.  Tomato-based foods such as sauce, chili, salsa, and pizza.  Fried and fatty foods.  Eat small, frequent meals instead of large meals. SEEK IMMEDIATE MEDICAL CARE IF:   You have black or dark red stools.  You vomit blood or material that looks like coffee grounds.  You are unable to keep fluids down.  Your abdominal pain gets worse.  You have a fever.  You do not feel better after 1 week.  You have any other questions or concerns. MAKE SURE YOU:  Understand these instructions.  Will watch your condition.  Will get help right away if you are not doing well or get worse. Document Released: 09/02/2001 Document Revised: 03/09/2012 Document Reviewed: 10/22/2011 [facility] Patient Information 2015 [employer], [place]. This information is not intended to replace advice given to you by your health care provider. Make sure you discuss any questions you have with your health care provider.

## 2015-05-23 IMAGING — CT CT HEAD W/O CM
1 series · 16 of 30 positions shown, 20 images · non-contrast
Comparison: 08/28/2013

CLINICAL DATA: Left-sided headache with vision change. History of
hypertension, diabetes and stroke.

EXAM:
CT HEAD WITHOUT CONTRAST
TECHNIQUE: Contiguous axial images were obtained from the base of the skull
through the vertex without intravenous contrast.

[Series 2: head 4.8 h37s · axial · 0.47mm/px · z∈[-153,+3]mm · 16 of 36 slices shown, 20 images]
[im 2/36  brain]
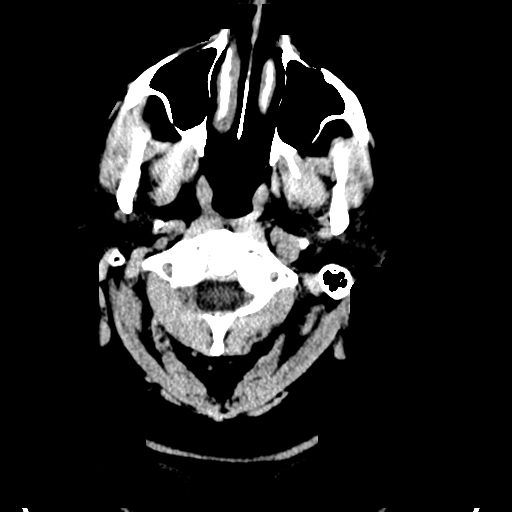
[im 2/36  bone]
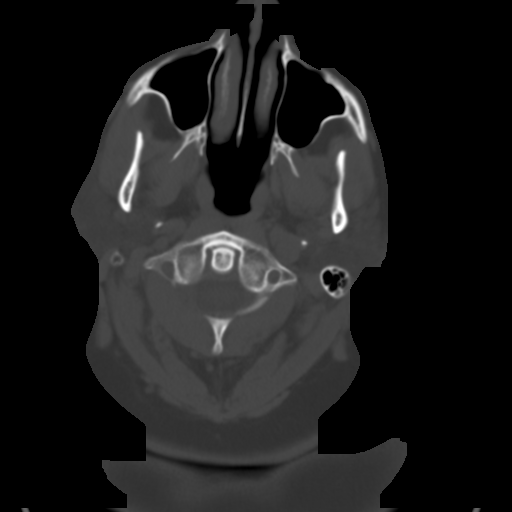
[im 4/36  brain]
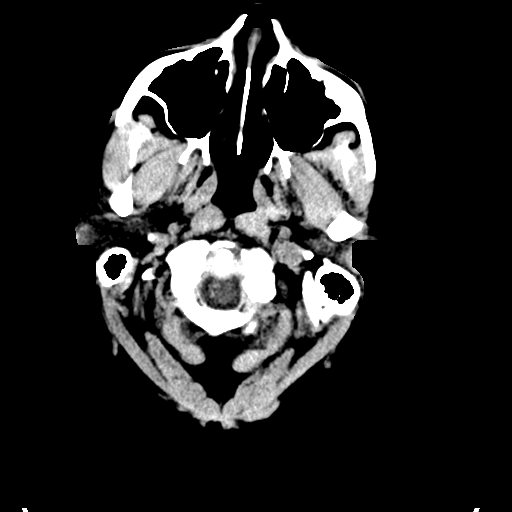
[im 7/36  brain]
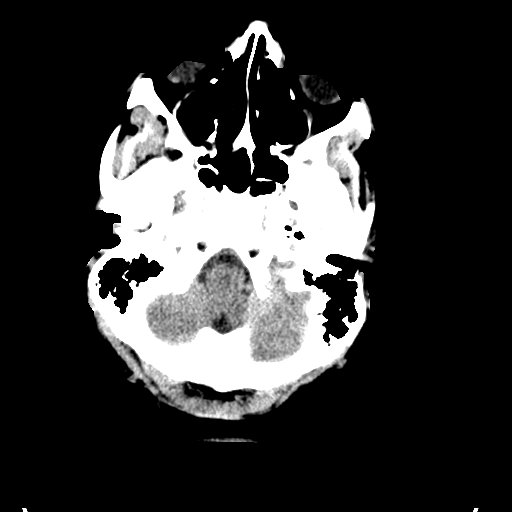
[im 9/36  brain]
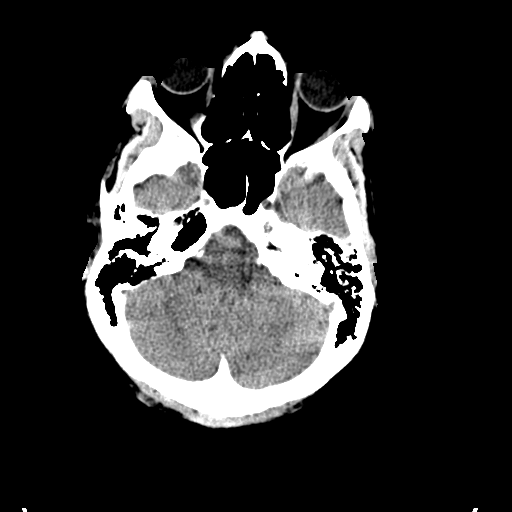
[im 10/36  brain]
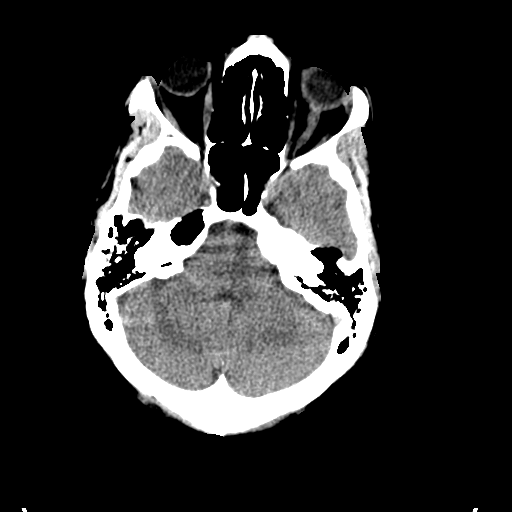
[im 10/36  bone]
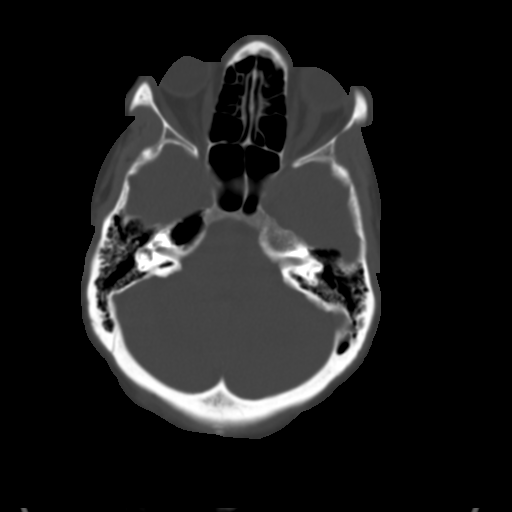
[im 13/36  brain]
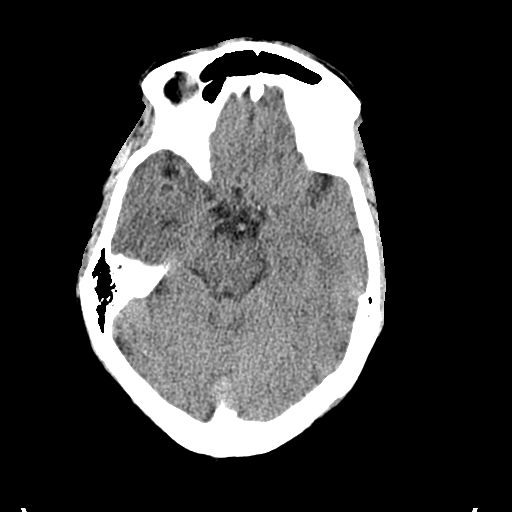
[im 15/36  brain]
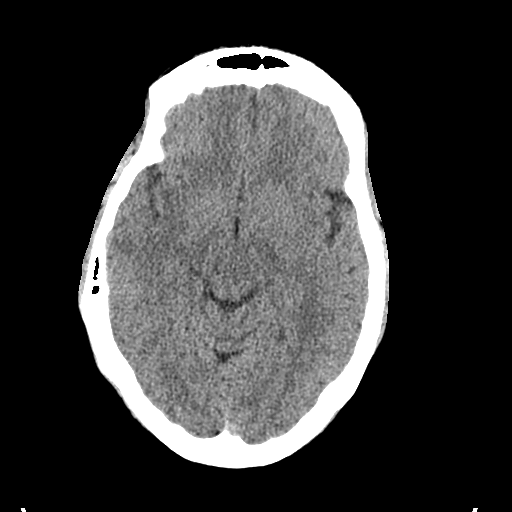
[im 17/36  brain]
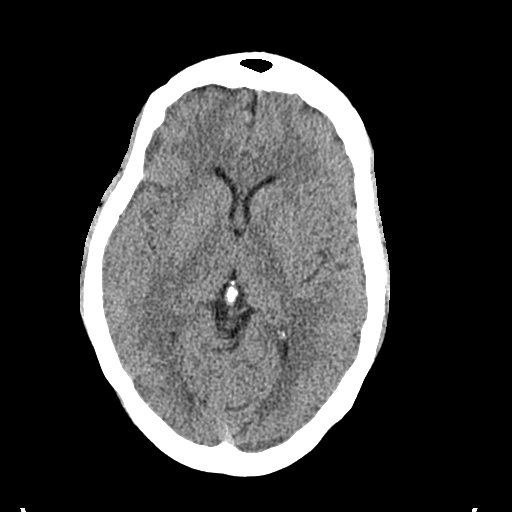
[im 19/36  brain]
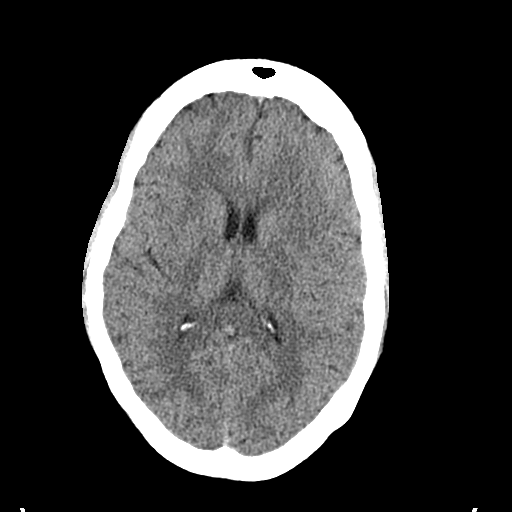
[im 19/36  bone]
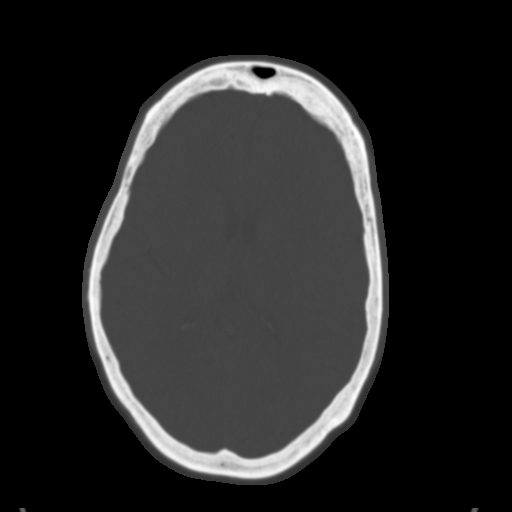
[im 21/36  brain]
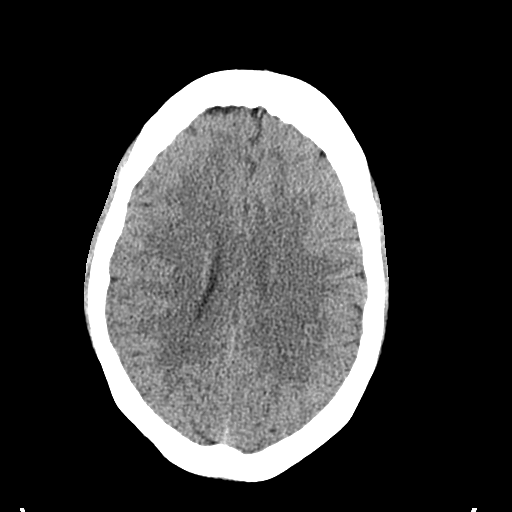
[im 23/36  brain]
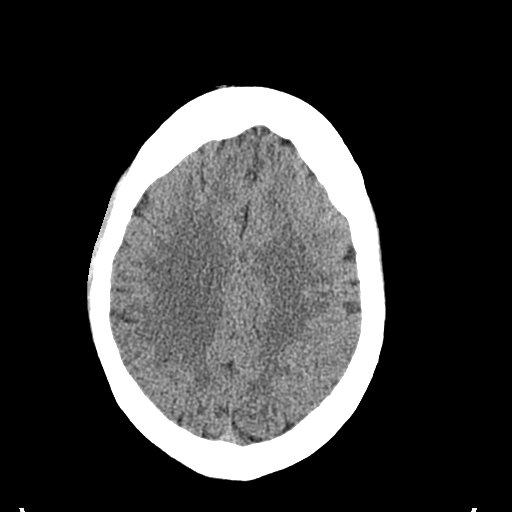
[im 26/36  brain]
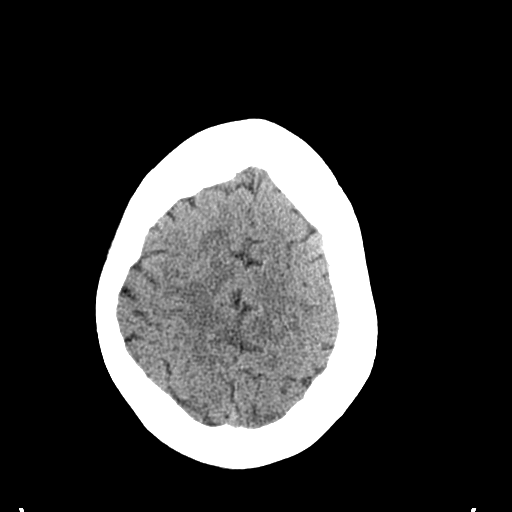
[im 27/36  brain]
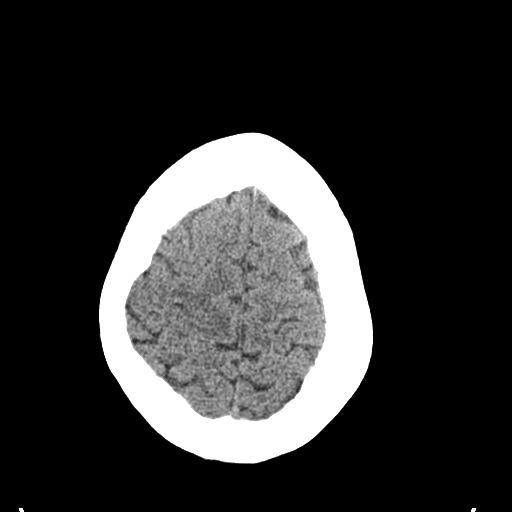
[im 27/36  bone]
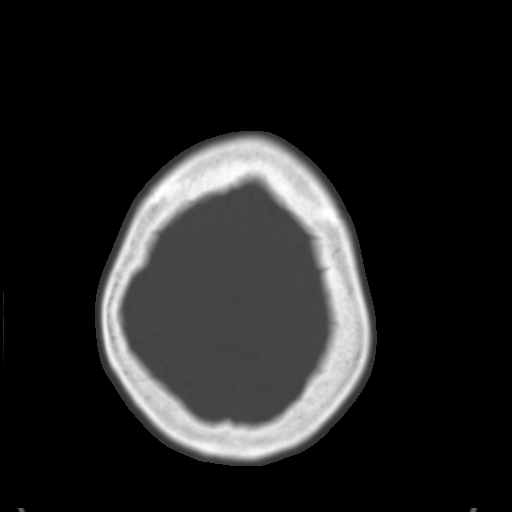
[im 29/36  brain]
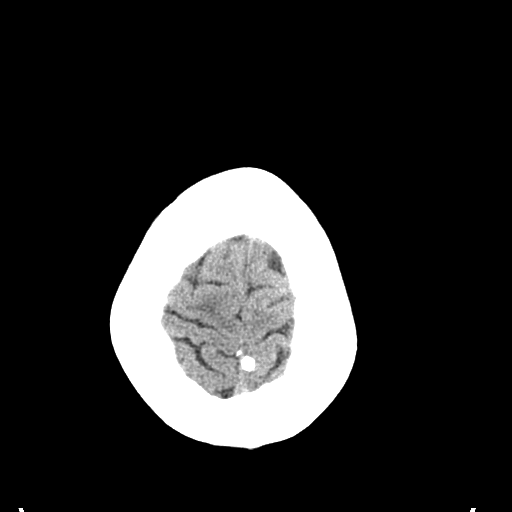
[im 32/36  brain]
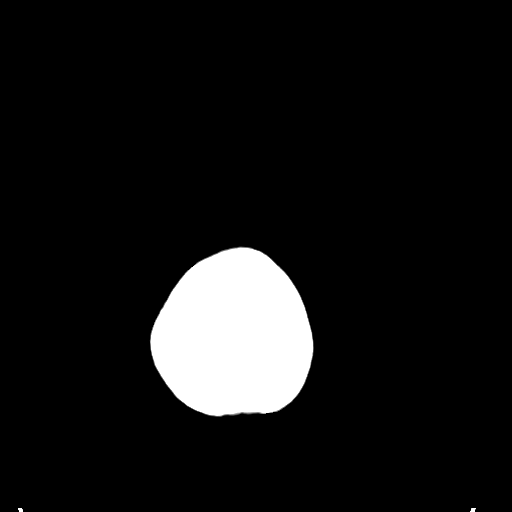
[im 34/36  brain]
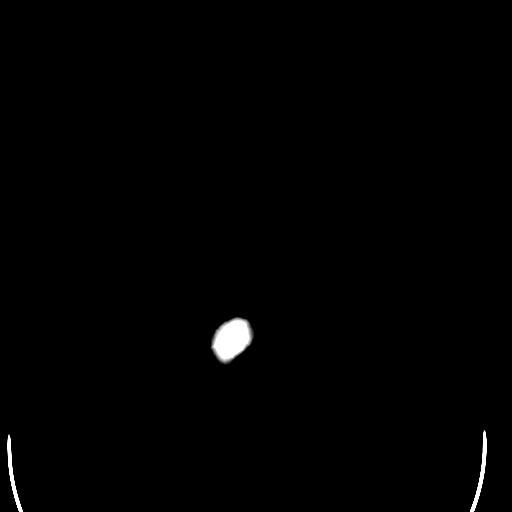

[16 of 30 positions shown; findings below may reference images not displayed]

FINDINGS: Gray-white differentiation is maintained. No CT evidence of acute
large territory infarct. No intraparenchymal or extra-axial mass or
hemorrhage. Normal size and configuration of the ventricles and
basilar cisterns. No midline shift. Limited visualization of the
paranasal sinuses and mastoid air cells are normal. No air-fluid
levels. Regional soft tissues appear normal.
IMPRESSION: Negative noncontrast head CT.
